# Patient Record
Sex: Female | Born: 1975 | Race: White | Hispanic: No | Marital: Married | Smoking: Never smoker
Health system: Southern US, Community
[De-identification: ages and names within clinical notes are randomized; demographics above are authoritative.]

---

## 2008-05-24 ENCOUNTER — Encounter: Admission: RE | Admit: 2008-05-24 | Discharge: 2008-05-24 | Payer: Self-pay | Admitting: Obstetrics and Gynecology

## 2008-09-17 ENCOUNTER — Encounter: Admission: RE | Admit: 2008-09-17 | Discharge: 2008-09-17 | Payer: Self-pay | Admitting: Obstetrics and Gynecology

## 2009-11-19 HISTORY — PX: KNEE ARTHROSCOPY: SUR90

## 2009-11-19 LAB — HM MAMMOGRAPHY

## 2010-03-23 ENCOUNTER — Encounter: Admission: RE | Admit: 2010-03-23 | Discharge: 2010-03-23 | Payer: Self-pay | Admitting: Specialist

## 2010-05-18 ENCOUNTER — Encounter: Admission: RE | Admit: 2010-05-18 | Discharge: 2010-05-18 | Payer: Self-pay | Admitting: Obstetrics & Gynecology

## 2010-12-11 ENCOUNTER — Encounter: Payer: Self-pay | Admitting: Obstetrics and Gynecology

## 2011-07-26 ENCOUNTER — Other Ambulatory Visit: Payer: Self-pay | Admitting: Specialist

## 2011-07-26 DIAGNOSIS — M25562 Pain in left knee: Secondary | ICD-10-CM

## 2011-07-28 ENCOUNTER — Ambulatory Visit
Admission: RE | Admit: 2011-07-28 | Discharge: 2011-07-28 | Disposition: A | Discharge status: Home / Self Care | Payer: BC Managed Care – PPO | Source: Ambulatory Visit | Attending: Specialist | Admitting: Specialist

## 2011-07-28 DIAGNOSIS — M25562 Pain in left knee: Secondary | ICD-10-CM

## 2011-11-20 LAB — HM PAP SMEAR

## 2012-02-14 LAB — BASIC METABOLIC PANEL: Creatinine: 0.8 mg/dL (ref 0.5–1.1)

## 2012-02-14 LAB — HEPATIC FUNCTION PANEL
ALT: 14 U/L (ref 7–35)
AST: 19 U/L (ref 13–35)
Alkaline Phosphatase: 48 U/L (ref 25–125)
Bilirubin, Total: 0.7 mg/dL

## 2012-02-14 LAB — CBC AND DIFFERENTIAL: HCT: 40 % (ref 36–46)

## 2012-02-14 LAB — LIPID PANEL
Cholesterol: 223 mg/dL — AB (ref 0–200)
HDL: 72 mg/dL — AB (ref 35–70)
Triglycerides: 56 mg/dL (ref 40–160)

## 2012-02-14 LAB — TSH: TSH: 1.78 u[IU]/mL (ref 0.41–5.90)

## 2012-09-12 ENCOUNTER — Telehealth: Payer: Self-pay | Admitting: Internal Medicine

## 2012-09-12 NOTE — Telephone Encounter (Signed)
Okay for schedulers to continue to work with her until this is arranged

## 2012-09-12 NOTE — Telephone Encounter (Signed)
Called pt, no answer, lvmom   

## 2012-09-12 NOTE — Telephone Encounter (Signed)
yes, it is okay to work her in. Thanks

## 2012-09-12 NOTE — Telephone Encounter (Signed)
The patient called and is hoping to be a new patient with Dr.Leschber.  She was given the next available, but stated she needs to be seen sooner and is requesting a new pt apt as soon as possible.  Do you want her added?

## 2012-09-16 NOTE — Telephone Encounter (Signed)
Called pt, no answer, lvmom.  Will try back soon if call isn't returned.

## 2012-09-16 NOTE — Telephone Encounter (Signed)
Ok noted. thanks

## 2012-11-07 ENCOUNTER — Encounter: Payer: Self-pay | Admitting: Internal Medicine

## 2012-11-07 ENCOUNTER — Ambulatory Visit (INDEPENDENT_AMBULATORY_CARE_PROVIDER_SITE_OTHER): Payer: BC Managed Care – PPO | Admitting: Internal Medicine

## 2012-11-07 VITALS — BP 126/78 | HR 79 | Temp 98.1°F | Ht 65.5 in | Wt 152.0 lb

## 2012-11-07 DIAGNOSIS — J309 Allergic rhinitis, unspecified: Secondary | ICD-10-CM | POA: Insufficient documentation

## 2012-11-07 DIAGNOSIS — J45991 Cough variant asthma: Secondary | ICD-10-CM | POA: Insufficient documentation

## 2012-11-07 MED ORDER — FLUTICASONE PROPIONATE HFA 110 MCG/ACT IN AERO: 1.0000 | INHALATION_SPRAY | Freq: Two times a day (BID) | RESPIRATORY_TRACT | Status: AC

## 2012-11-07 MED ORDER — ALBUTEROL SULFATE HFA 108 (90 BASE) MCG/ACT IN AERS: 2.0000 | INHALATION_SPRAY | Freq: Four times a day (QID) | RESPIRATORY_TRACT | Status: DC | PRN

## 2012-11-07 MED ORDER — FLUTICASONE PROPIONATE 50 MCG/ACT NA SUSP
2.0000 | Freq: Every day | NASAL | Status: AC
Start: 2012-11-07 — End: ?

## 2012-11-07 NOTE — Patient Instructions (Signed)
It was good to see you today. We have reviewed your prior records including labs and tests today. Health Maintenance reviewed - all recommended immunizations and age-appropriate screenings are up-to-date. If you develop worsening symptoms or fever, call and we can reconsider antibiotics, but it does not appear necessary to use antibiotics at this time. Start Flonase, Flovent and recuse Albuterol prn - Your prescription(s) have been submitted to your pharmacy. Please take as directed and contact our office if you believe you are having problem(s) with the medication(s). we'll make referral for PFTs . Our office will contact you regarding appointment(s) once made. Please schedule followup in 1-2 years for medical physical, call sooner if problems.

## 2012-11-07 NOTE — Progress Notes (Signed)
  Subjective:    Patient ID: Donna Reilly, female    DOB: 1976/09/04, 36 y.o.   MRN: [id number]  HPI  New pt to me and our practice, here to establish care  complains of cough Ongoing approx 6 weeks -  Describes as dry cough, no sputum associated with headache, mild dyspnea on exertion and occasional exercise induced/cold weather wheeze symptoms worse at night Improved with use of son's inhaled steroid Hx same each fall  History reviewed. No pertinent past medical history.  Family History  Problem Relation Age of Onset  . Lung cancer Paternal Grandmother   . Hyperlipidemia Mother   . Hyperlipidemia Father   . Hypertension Mother   . Hypertension Father   . Coronary artery disease Paternal Uncle   . Cardiomyopathy Brother    History  Substance Use Topics  . Smoking status: Never Smoker   . Smokeless tobacco: Never Used     Comment: local ob-gyn, lives with spouse and 2 kids  . Alcohol Use: Yes    Review of Systems Constitutional: Negative for fever or weight change.  Respiratory: Negative for cough and shortness of breath.   Cardiovascular: Negative for chest pain or palpitations.  Gastrointestinal: Negative for abdominal pain, no bowel changes.  Musculoskeletal: Negative for gait problem or joint swelling.  Skin: Negative for rash.  Neurological: Negative for dizziness or headache.  No other specific complaints in a complete review of systems (except as listed in HPI above).     Objective:   Physical Exam BP 126/78  Pulse 79  Temp 98.1 F (36.7 C) (Oral)  Ht 5' 5.5" (1.664 m)  Wt 152 lb (68.947 kg)  BMI 24.91 kg/m2  SpO2 95% Wt Readings from Last 3 Encounters:  11/07/12 152 lb (68.947 kg)   Constitutional: She appears well-developed and well-nourished. No distress.  HENT: Head: Normocephalic and atraumatic. Ears: B TMs ok, no erythema or effusion; Nose: R nostril with scarring from prior trauma - mild turbinate swelling and pallor. Mouth/Throat: Oropharynx  is clear and moist. No oropharyngeal exudate.  Eyes: Conjunctivae and EOM are normal. Pupils are equal, round, and reactive to light. No scleral icterus.  Neck: Normal range of motion. Neck supple. No JVD or LAD present. No thyromegaly present.  Cardiovascular: Normal rate, regular rhythm and normal heart sounds.  No murmur heard. No BLE edema. Pulmonary/Chest: Effort normal and breath sounds normal. No respiratory distress. She has no wheezes.  Neurological: She is alert and oriented to person, place, and time. No cranial nerve deficit. Coordination normal.  Skin: Skin is warm and dry. No rash noted. No erythema.  Psychiatric: She has a normal mood and affect. Her behavior is normal. Judgment and thought content normal.   Lab Results  Component Value Date   WBC 5.3 02/14/2012   HGB 13.1 02/14/2012   HCT 40 02/14/2012   PLT 244 02/14/2012   CHOL 223* 02/14/2012   TRIG 56 02/14/2012   HDL 72* 02/14/2012   LDLCALC 140 02/14/2012   ALT 14 02/14/2012   AST 19 02/14/2012   NA 140 02/14/2012   K 4.2 02/14/2012   CREATININE 0.8 02/14/2012   BUN 15 02/14/2012   TSH 1.78 02/14/2012       Assessment & Plan:   See problem list. Medications and labs reviewed today.

## 2012-11-07 NOTE — Assessment & Plan Note (Signed)
Seasonal - may contribute to seasonal cough symptoms Start nasal steroid - OTC antihistamine prn

## 2012-11-07 NOTE — Assessment & Plan Note (Signed)
Seasonal dry cough - ongoing for years Also hx cold and exercise induced asthma - Refer for PFTs now Has improved with use of son's inhaled steroid - will rx same and Alb MDI prn

## 2016-02-23 DIAGNOSIS — M20012 Mallet finger of left finger(s): Secondary | ICD-10-CM | POA: Diagnosis not present

## 2016-03-06 DIAGNOSIS — N390 Urinary tract infection, site not specified: Secondary | ICD-10-CM | POA: Diagnosis not present

## 2016-03-21 DIAGNOSIS — M20012 Mallet finger of left finger(s): Secondary | ICD-10-CM | POA: Diagnosis not present

## 2016-04-09 DIAGNOSIS — M20012 Mallet finger of left finger(s): Secondary | ICD-10-CM | POA: Diagnosis not present

## 2016-04-18 DIAGNOSIS — M20012 Mallet finger of left finger(s): Secondary | ICD-10-CM | POA: Diagnosis not present

## 2016-04-18 DIAGNOSIS — M79645 Pain in left finger(s): Secondary | ICD-10-CM | POA: Diagnosis not present

## 2016-08-17 DIAGNOSIS — R509 Fever, unspecified: Secondary | ICD-10-CM | POA: Diagnosis not present

## 2017-03-21 DIAGNOSIS — Z1231 Encounter for screening mammogram for malignant neoplasm of breast: Secondary | ICD-10-CM | POA: Diagnosis not present

## 2017-03-22 ENCOUNTER — Other Ambulatory Visit: Payer: Self-pay | Admitting: Obstetrics & Gynecology

## 2017-03-22 DIAGNOSIS — N6320 Unspecified lump in the left breast, unspecified quadrant: Secondary | ICD-10-CM

## 2017-03-26 ENCOUNTER — Other Ambulatory Visit: Payer: Self-pay | Admitting: Obstetrics & Gynecology

## 2017-03-26 ENCOUNTER — Ambulatory Visit
Admission: RE | Admit: 2017-03-26 | Discharge: 2017-03-26 | Disposition: A | Discharge status: Home / Self Care | Payer: Self-pay | Source: Ambulatory Visit | Attending: Obstetrics & Gynecology | Admitting: Obstetrics & Gynecology

## 2017-03-26 DIAGNOSIS — N6320 Unspecified lump in the left breast, unspecified quadrant: Secondary | ICD-10-CM

## 2017-03-26 DIAGNOSIS — R928 Other abnormal and inconclusive findings on diagnostic imaging of breast: Secondary | ICD-10-CM | POA: Diagnosis not present

## 2017-03-26 DIAGNOSIS — N6489 Other specified disorders of breast: Secondary | ICD-10-CM | POA: Diagnosis not present

## 2017-08-27 DIAGNOSIS — J45909 Unspecified asthma, uncomplicated: Secondary | ICD-10-CM | POA: Diagnosis not present

## 2017-08-27 DIAGNOSIS — J069 Acute upper respiratory infection, unspecified: Secondary | ICD-10-CM | POA: Diagnosis not present

## 2017-08-27 DIAGNOSIS — Z6825 Body mass index (BMI) 25.0-25.9, adult: Secondary | ICD-10-CM | POA: Diagnosis not present

## 2017-09-04 ENCOUNTER — Other Ambulatory Visit: Payer: Self-pay | Admitting: Obstetrics and Gynecology

## 2017-09-04 ENCOUNTER — Ambulatory Visit
Admission: RE | Admit: 2017-09-04 | Discharge: 2017-09-04 | Disposition: A | Discharge status: Home / Self Care | Payer: No Typology Code available for payment source | Source: Ambulatory Visit | Attending: Obstetrics and Gynecology | Admitting: Obstetrics and Gynecology

## 2017-09-04 DIAGNOSIS — R059 Cough, unspecified: Secondary | ICD-10-CM

## 2017-09-04 DIAGNOSIS — Z6825 Body mass index (BMI) 25.0-25.9, adult: Secondary | ICD-10-CM | POA: Diagnosis not present

## 2017-09-04 DIAGNOSIS — J069 Acute upper respiratory infection, unspecified: Secondary | ICD-10-CM | POA: Diagnosis not present

## 2017-09-04 DIAGNOSIS — J309 Allergic rhinitis, unspecified: Secondary | ICD-10-CM | POA: Diagnosis not present

## 2017-09-04 DIAGNOSIS — R05 Cough: Secondary | ICD-10-CM | POA: Diagnosis not present

## 2017-09-04 DIAGNOSIS — J45909 Unspecified asthma, uncomplicated: Secondary | ICD-10-CM | POA: Diagnosis not present

## 2017-09-24 DIAGNOSIS — Z01419 Encounter for gynecological examination (general) (routine) without abnormal findings: Secondary | ICD-10-CM | POA: Diagnosis not present

## 2017-09-24 DIAGNOSIS — Z3202 Encounter for pregnancy test, result negative: Secondary | ICD-10-CM | POA: Diagnosis not present

## 2017-09-24 DIAGNOSIS — Z1151 Encounter for screening for human papillomavirus (HPV): Secondary | ICD-10-CM | POA: Diagnosis not present

## 2017-09-24 DIAGNOSIS — Z30433 Encounter for removal and reinsertion of intrauterine contraceptive device: Secondary | ICD-10-CM | POA: Diagnosis not present

## 2017-09-24 DIAGNOSIS — Z124 Encounter for screening for malignant neoplasm of cervix: Secondary | ICD-10-CM | POA: Diagnosis not present

## 2018-07-29 DIAGNOSIS — J069 Acute upper respiratory infection, unspecified: Secondary | ICD-10-CM | POA: Diagnosis not present

## 2018-07-29 DIAGNOSIS — J45909 Unspecified asthma, uncomplicated: Secondary | ICD-10-CM | POA: Diagnosis not present

## 2018-07-29 DIAGNOSIS — Z6824 Body mass index (BMI) 24.0-24.9, adult: Secondary | ICD-10-CM | POA: Diagnosis not present

## 2018-07-29 DIAGNOSIS — J309 Allergic rhinitis, unspecified: Secondary | ICD-10-CM | POA: Diagnosis not present

## 2018-12-22 ENCOUNTER — Other Ambulatory Visit: Payer: Self-pay | Admitting: Obstetrics

## 2019-12-09 DIAGNOSIS — M25531 Pain in right wrist: Secondary | ICD-10-CM | POA: Diagnosis not present

## 2019-12-09 DIAGNOSIS — M7711 Lateral epicondylitis, right elbow: Secondary | ICD-10-CM | POA: Diagnosis not present

## 2020-01-23 ENCOUNTER — Ambulatory Visit: Payer: No Typology Code available for payment source | Attending: Internal Medicine

## 2020-01-23 DIAGNOSIS — Z23 Encounter for immunization: Secondary | ICD-10-CM | POA: Insufficient documentation

## 2020-01-23 NOTE — Progress Notes (Signed)
   Covid-19 Vaccination Clinic  Name:  Margeret Stachnik    MRN: [id number] DOB: 1976-10-11  01/23/2020  Ms. Bunker was observed post Covid-19 immunization for 15 minutes without incident. She was provided with Vaccine Information Sheet and instruction to access the V-Safe system.   Ms. Koslow was instructed to call 911 with any severe reactions post vaccine: Marland Kitchen Difficulty breathing  . Swelling of face and throat  . A fast heartbeat  . A bad rash all over body  . Dizziness and weakness   Immunizations Administered    Name Date Dose VIS Date Route   Pfizer COVID-19 Vaccine 01/23/2020  9:18 AM 0.3 mL 10/30/2019 Intramuscular   Manufacturer: [place]   Lot: [id number]   [place]: [id number]

## 2020-02-13 ENCOUNTER — Ambulatory Visit: Payer: No Typology Code available for payment source | Attending: Internal Medicine

## 2020-03-15 DIAGNOSIS — H5203 Hypermetropia, bilateral: Secondary | ICD-10-CM | POA: Diagnosis not present

## 2020-03-15 DIAGNOSIS — H524 Presbyopia: Secondary | ICD-10-CM | POA: Diagnosis not present

## 2020-12-12 ENCOUNTER — Other Ambulatory Visit: Payer: Self-pay | Admitting: Obstetrics & Gynecology

## 2020-12-12 DIAGNOSIS — N644 Mastodynia: Secondary | ICD-10-CM

## 2020-12-14 ENCOUNTER — Other Ambulatory Visit: Payer: No Typology Code available for payment source

## 2021-03-31 ENCOUNTER — Other Ambulatory Visit (HOSPITAL_COMMUNITY): Payer: Self-pay | Admitting: Cardiology

## 2021-03-31 ENCOUNTER — Ambulatory Visit (HOSPITAL_COMMUNITY)
Admission: RE | Admit: 2021-03-31 | Discharge: 2021-03-31 | Disposition: A | Discharge status: Home / Self Care | Payer: BC Managed Care – PPO | Source: Ambulatory Visit | Attending: Cardiology | Admitting: Cardiology

## 2021-03-31 ENCOUNTER — Other Ambulatory Visit: Payer: Self-pay

## 2021-03-31 DIAGNOSIS — I2510 Atherosclerotic heart disease of native coronary artery without angina pectoris: Secondary | ICD-10-CM

## 2021-05-16 LAB — COLOGUARD: COLOGUARD: NEGATIVE

## 2021-08-05 IMAGING — CT CT CARDIAC CORONARY ARTERY CALCIUM SCORE
3 series · 14 of 20 positions shown, 15 images · non-contrast
Comparison: None.

Addendum:
CLINICAL DATA: Cardiovascular Disease Risk stratification

EXAM:
Coronary Calcium Score
TECHNIQUE: A gated, non-contrast computed tomography scan of the heart was
performed using 3mm slice thickness. Axial images were analyzed on a
dedicated workstation. Calcium scoring of the coronary arteries was
performed using the Agatston method.

[Series 3: 2 hrt calcium · axial · 0.31mm/px · z∈[-26,+55]mm · 4 of 47 slices shown, 5 images]
[im 10/47  vessel]
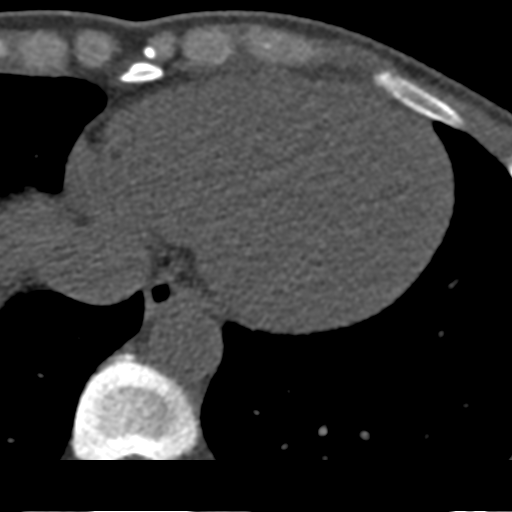
[im 10/47  lung]
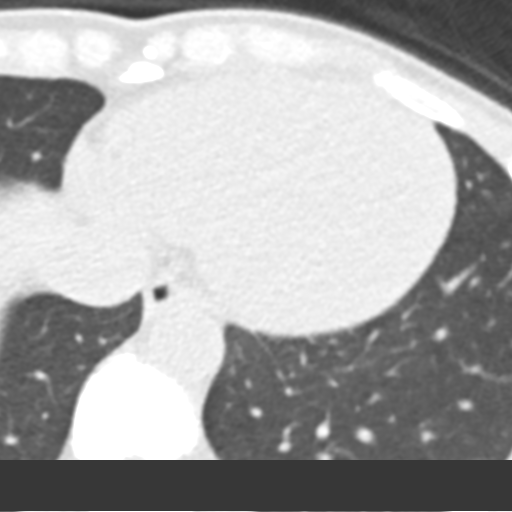
[im 19/47  vessel]
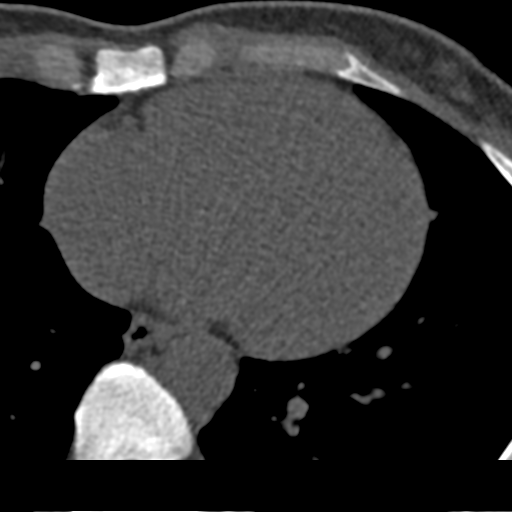
[im 28/47  vessel]
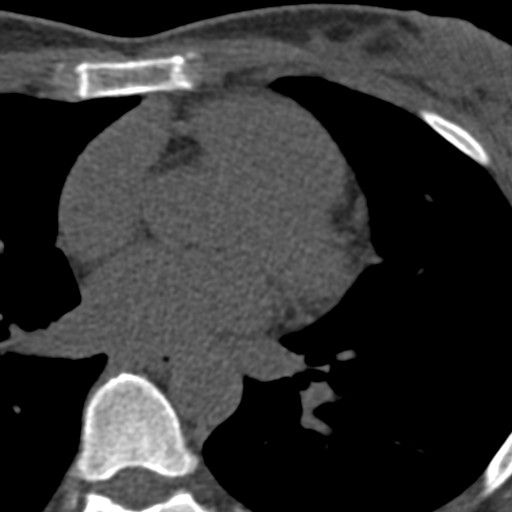
[im 37/47  vessel]
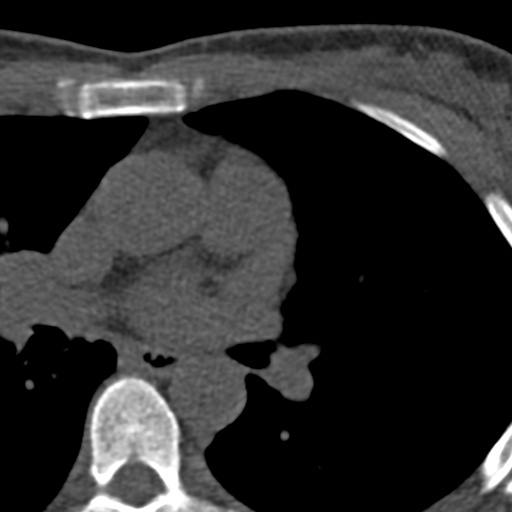

[Series 4: 2 soft full fov 74 % · axial · 0.74mm/px · z∈[-35,+61]mm · 5 of 48 slices shown]
[im 8/48  vessel]
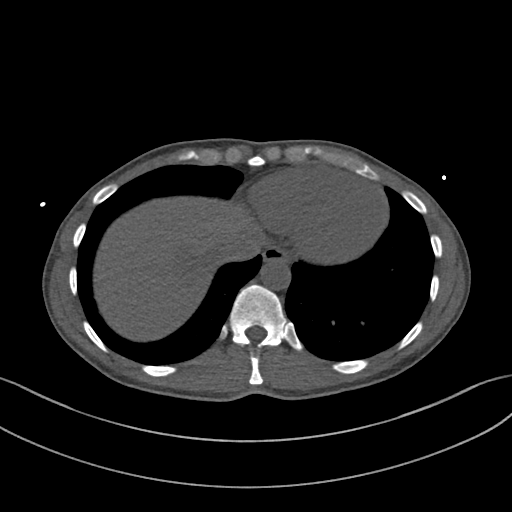
[im 16/48  vessel]
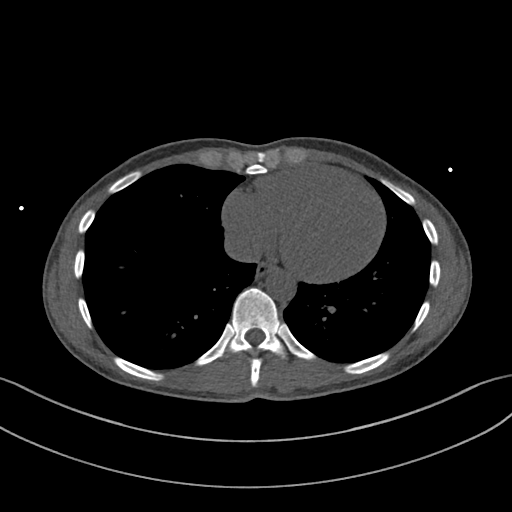
[im 24/48  vessel]
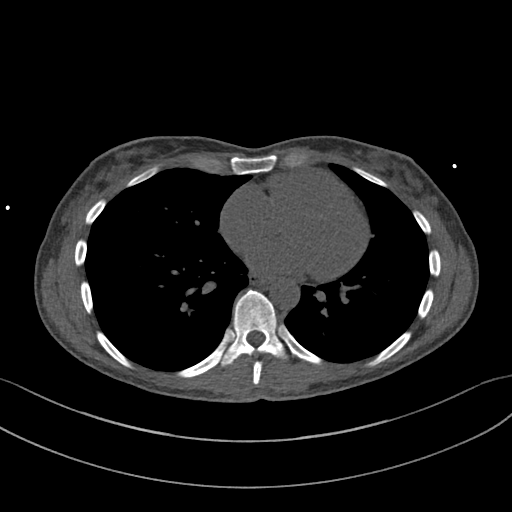
[im 32/48  vessel]
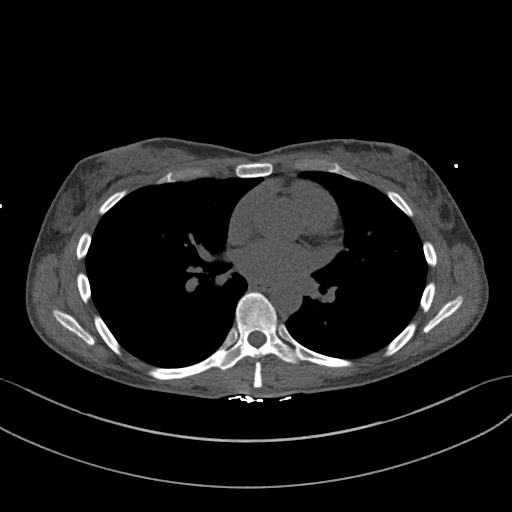
[im 40/48  vessel]
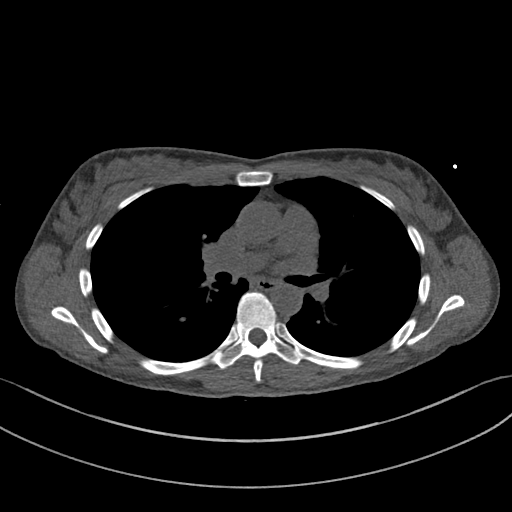

[Series 6: 2 lungs 74 % · axial · 0.74mm/px · z∈[-35,+61]mm · 5 of 48 slices shown]
[im 8/48  vessel]
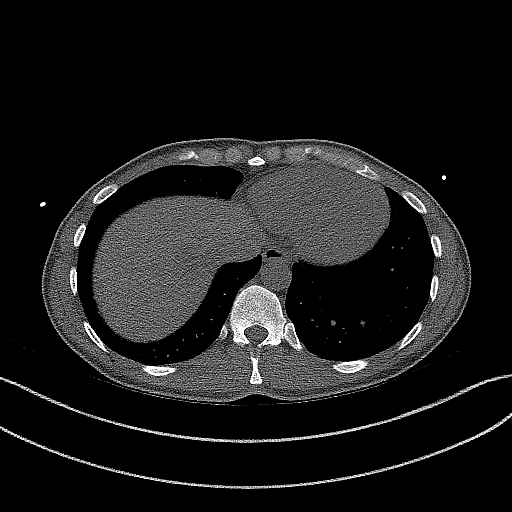
[im 16/48  vessel]
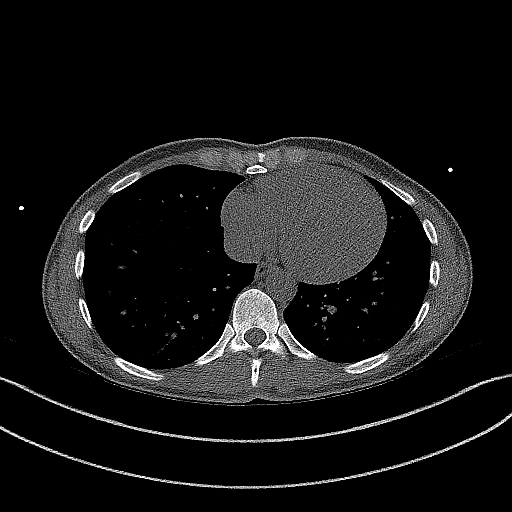
[im 24/48  vessel]
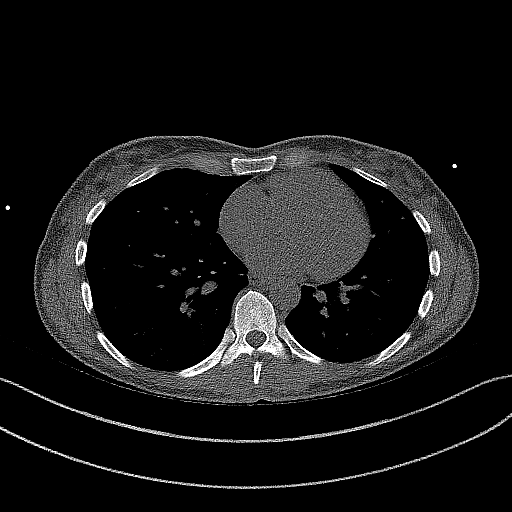
[im 32/48  vessel]
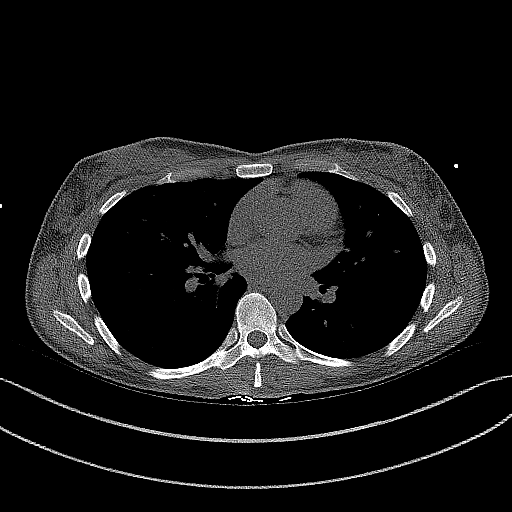
[im 40/48  vessel]
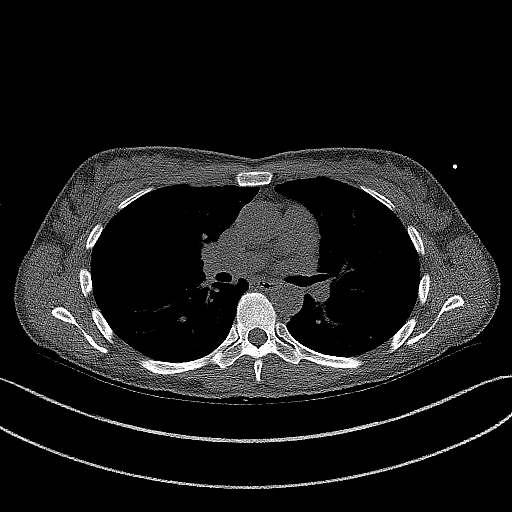

[14 of 20 positions shown; findings below may reference images not displayed]

FINDINGS: Coronary arteries: Normal origins.

Coronary Calcium Score:

Left main: 0

Left anterior descending artery: 0

Left circumflex artery: 0

Right coronary artery: 0

Total: 0

Percentile: 0

Pericardium: Normal.

Ascending Aorta: Normal caliber.

Non-cardiac: See separate report from [REDACTED].
IMPRESSION: Coronary calcium score of 0. This was 0 percentile for age-, race-,
and sex-matched controls.



If CAC=0, it is reasonable to withhold statin therapy and reassess
in 5 to 10 years, as long as higher risk conditions are absent
(diabetes mellitus, family history of premature CHD in first degree
relatives (males <55 years; females <65 years), cigarette smoking,
or LDL >=190 mg/dL).

If CAC is 1 to 99, it is reasonable to initiate statin therapy for
patients >=55 years of age.

If CAC is >=100 or >=75th percentile, it is reasonable to initiate
statin therapy at any age.

Cardiology referral should be considered for patients with CAC
scores >=400 or >=75th percentile.

*1702 AHA/ACC/AACVPR/AAPA/ABC/CONVEXA/REDON/MYHRE/Jito/SHUKRI/MOLANO/TOLBERT
Guideline on the Management of Blood Cholesterol: A Report of the
American College of Cardiology/American Heart Association Task Force
on Clinical Practice Guidelines. J Am Coll Cardiol.
1667;73(24):1836-1016.

EXAM:
OVER-READ INTERPRETATION  CT CHEST

The following report is an over-read performed by radiologist Dr.
Gabla Bilijo [REDACTED] on 04/03/2021. This over-read
does not include interpretation of cardiac or coronary anatomy or
pathology. The coronary calcium score interpretation by the
cardiologist is attached.
FINDINGS: Mediastinum/Nodes: No mediastinal lymphadenopathy. The visualized
trachea and esophagus are within normal limits.

Lungs/Pleura: No focal consolidations, evidence of pleural effusion,
or pneumothorax. No suspicious pulmonary nodules.

Chest Wall: Within normal limits.

Upper Abdomen: Within normal limits.

Musculoskeletal: No acute osseous abnormality or aggressive
appearing osseous lesion.
IMPRESSION: No significant extracardiac thoracic abnormality.

*** End of Addendum ***
FINDINGS: Coronary arteries: Normal origins.

Coronary Calcium Score:

Left main: 0

Left anterior descending artery: 0

Left circumflex artery: 0

Right coronary artery: 0

Total: 0

Percentile: 0

Pericardium: Normal.

Ascending Aorta: Normal caliber.

Non-cardiac: See separate report from [REDACTED].
IMPRESSION: Coronary calcium score of 0. This was 0 percentile for age-, race-,
and sex-matched controls.



If CAC=0, it is reasonable to withhold statin therapy and reassess
in 5 to 10 years, as long as higher risk conditions are absent
(diabetes mellitus, family history of premature CHD in first degree
relatives (males <55 years; females <65 years), cigarette smoking,
or LDL >=190 mg/dL).

If CAC is 1 to 99, it is reasonable to initiate statin therapy for
patients >=55 years of age.

If CAC is >=100 or >=75th percentile, it is reasonable to initiate
statin therapy at any age.

Cardiology referral should be considered for patients with CAC
scores >=400 or >=75th percentile.

*1702 AHA/ACC/AACVPR/AAPA/ABC/CONVEXA/REDON/MYHRE/Jito/SHUKRI/MOLANO/TOLBERT
Guideline on the Management of Blood Cholesterol: A Report of the
American College of Cardiology/American Heart Association Task Force
on Clinical Practice Guidelines. J Am Coll Cardiol.
1667;73(24):1836-1016.

## 2021-12-08 ENCOUNTER — Other Ambulatory Visit (HOSPITAL_COMMUNITY): Payer: Self-pay | Admitting: Obstetrics & Gynecology

## 2021-12-08 DIAGNOSIS — N644 Mastodynia: Secondary | ICD-10-CM

## 2023-06-16 ENCOUNTER — Encounter (HOSPITAL_COMMUNITY): Payer: Self-pay

## 2023-06-16 ENCOUNTER — Emergency Department (HOSPITAL_COMMUNITY): Payer: BC Managed Care – PPO

## 2023-06-16 ENCOUNTER — Inpatient Hospital Stay (HOSPITAL_COMMUNITY)
Admission: EM | Admit: 2023-06-16 | Discharge: 2023-06-17 | DRG: 254 | Disposition: A | Discharge status: Home / Self Care | Payer: BC Managed Care – PPO | Attending: Pulmonary Disease | Admitting: Pulmonary Disease

## 2023-06-16 ENCOUNTER — Inpatient Hospital Stay (HOSPITAL_COMMUNITY): Payer: BC Managed Care – PPO

## 2023-06-16 ENCOUNTER — Ambulatory Visit (HOSPITAL_BASED_OUTPATIENT_CLINIC_OR_DEPARTMENT_OTHER)
Admission: RE | Admit: 2023-06-16 | Discharge: 2023-06-16 | Disposition: A | Discharge status: Home / Self Care | Payer: BC Managed Care – PPO | Source: Ambulatory Visit | Attending: Obstetrics and Gynecology | Admitting: Obstetrics and Gynecology

## 2023-06-16 ENCOUNTER — Other Ambulatory Visit: Payer: Self-pay | Admitting: Obstetrics and Gynecology

## 2023-06-16 ENCOUNTER — Other Ambulatory Visit: Payer: Self-pay

## 2023-06-16 DIAGNOSIS — M7989 Other specified soft tissue disorders: Secondary | ICD-10-CM

## 2023-06-16 DIAGNOSIS — Z793 Long term (current) use of hormonal contraceptives: Secondary | ICD-10-CM

## 2023-06-16 DIAGNOSIS — I82622 Acute embolism and thrombosis of deep veins of left upper extremity: Secondary | ICD-10-CM | POA: Diagnosis present

## 2023-06-16 DIAGNOSIS — Z880 Allergy status to penicillin: Secondary | ICD-10-CM

## 2023-06-16 DIAGNOSIS — Z79899 Other long term (current) drug therapy: Secondary | ICD-10-CM

## 2023-06-16 DIAGNOSIS — I82B12 Acute embolism and thrombosis of left subclavian vein: Principal | ICD-10-CM | POA: Diagnosis present

## 2023-06-16 DIAGNOSIS — Z975 Presence of (intrauterine) contraceptive device: Secondary | ICD-10-CM | POA: Diagnosis not present

## 2023-06-16 DIAGNOSIS — I82A12 Acute embolism and thrombosis of left axillary vein: Secondary | ICD-10-CM

## 2023-06-16 DIAGNOSIS — Z7951 Long term (current) use of inhaled steroids: Secondary | ICD-10-CM | POA: Diagnosis not present

## 2023-06-16 DIAGNOSIS — T402X5A Adverse effect of other opioids, initial encounter: Secondary | ICD-10-CM | POA: Diagnosis not present

## 2023-06-16 DIAGNOSIS — F419 Anxiety disorder, unspecified: Secondary | ICD-10-CM | POA: Diagnosis present

## 2023-06-16 DIAGNOSIS — L299 Pruritus, unspecified: Secondary | ICD-10-CM | POA: Diagnosis not present

## 2023-06-16 DIAGNOSIS — R519 Headache, unspecified: Secondary | ICD-10-CM | POA: Diagnosis not present

## 2023-06-16 DIAGNOSIS — I82409 Acute embolism and thrombosis of unspecified deep veins of unspecified lower extremity: Secondary | ICD-10-CM | POA: Diagnosis present

## 2023-06-16 HISTORY — PX: IR INFUSION THROMBOL VENOUS INITIAL (MS): IMG5377

## 2023-06-16 HISTORY — PX: IR VENO/EXT/UNI LEFT: IMG675

## 2023-06-16 HISTORY — PX: IR ANGIOGRAM SELECTIVE EACH ADDITIONAL VESSEL: IMG667

## 2023-06-16 HISTORY — PX: IR US GUIDE VASC ACCESS LEFT: IMG2389

## 2023-06-16 LAB — CBC WITH DIFFERENTIAL/PLATELET
Abs Immature Granulocytes: 0.01 10*3/uL (ref 0.00–0.07)
Basophils Absolute: 0 10*3/uL (ref 0.0–0.1)
Basophils Relative: 0 %
Eosinophils Absolute: 0 10*3/uL (ref 0.0–0.5)
Eosinophils Relative: 0 %
HCT: 40.7 % (ref 36.0–46.0)
Hemoglobin: 13.7 g/dL (ref 12.0–15.0)
Immature Granulocytes: 0 %
Lymphocytes Relative: 27 %
Lymphs Abs: 2 10*3/uL (ref 0.7–4.0)
MCH: 31.5 pg (ref 26.0–34.0)
MCHC: 33.7 g/dL (ref 30.0–36.0)
MCV: 93.6 fL (ref 80.0–100.0)
Monocytes Absolute: 0.6 10*3/uL (ref 0.1–1.0)
Monocytes Relative: 7 %
Neutro Abs: 5 10*3/uL (ref 1.7–7.7)
Neutrophils Relative %: 66 %
Platelets: 258 10*3/uL (ref 150–400)
RBC: 4.35 MIL/uL (ref 3.87–5.11)
RDW: 12.2 % (ref 11.5–15.5)
WBC: 7.7 10*3/uL (ref 4.0–10.5)
nRBC: 0 % (ref 0.0–0.2)

## 2023-06-16 LAB — COMPREHENSIVE METABOLIC PANEL
ALT: 12 U/L (ref 0–44)
AST: 17 U/L (ref 15–41)
Albumin: 4.5 g/dL (ref 3.5–5.0)
Alkaline Phosphatase: 44 U/L (ref 38–126)
Anion gap: 11 (ref 5–15)
BUN: 14 mg/dL (ref 6–20)
CO2: 23 mmol/L (ref 22–32)
Calcium: 9.7 mg/dL (ref 8.9–10.3)
Chloride: 102 mmol/L (ref 98–111)
Creatinine, Ser: 0.85 mg/dL (ref 0.44–1.00)
GFR, Estimated: >60 mL/min (ref >60)
Glucose, Bld: 94 mg/dL (ref 70–99)
Potassium: 3.7 mmol/L (ref 3.5–5.1)
Sodium: 136 mmol/L (ref 135–145)
Total Bilirubin: 0.9 mg/dL (ref 0.3–1.2)
Total Protein: 8.2 g/dL — ABNORMAL HIGH (ref 6.5–8.1)

## 2023-06-16 LAB — PROTIME-INR
INR: 1.1 (ref 0.8–1.2)
Prothrombin Time: 14.6 seconds (ref 11.4–15.2)

## 2023-06-16 LAB — ANTITHROMBIN III: AntiThromb III Func: 109 % (ref 75–120)

## 2023-06-16 LAB — APTT: aPTT: 31 seconds (ref 24–36)

## 2023-06-16 LAB — HCG, SERUM, QUALITATIVE: Preg, Serum: NEGATIVE

## 2023-06-16 LAB — MRSA NEXT GEN BY PCR, NASAL: MRSA by PCR Next Gen: NOT DETECTED

## 2023-06-16 MED ORDER — SODIUM CHLORIDE 0.9 % IV BOLUS
1000.0000 mL | Freq: Once | INTRAVENOUS | Status: AC
  Administered 2023-06-16: 1000 mL via INTRAVENOUS

## 2023-06-16 MED ORDER — ORAL CARE MOUTH RINSE: 15.0000 mL | OROMUCOSAL | Status: DC | PRN

## 2023-06-16 MED ORDER — SODIUM CHLORIDE 0.9 % IV SOLN: Freq: Once | INTRAVENOUS | Status: AC

## 2023-06-16 MED ORDER — ALBUTEROL SULFATE (2.5 MG/3ML) 0.083% IN NEBU: 2.5000 mg | INHALATION_SOLUTION | RESPIRATORY_TRACT | Status: DC | PRN

## 2023-06-16 MED ORDER — IOHEXOL 300 MG/ML  SOLN
150.0000 mL | Freq: Once | INTRAMUSCULAR | Status: AC | PRN
  Administered 2023-06-16: 35 mL via INTRAVENOUS

## 2023-06-16 MED ORDER — SODIUM CHLORIDE 0.9 % IV SOLN
250.0000 mL | INTRAVENOUS | Status: DC | PRN
  Administered 2023-06-16: 250 mL via INTRAVENOUS

## 2023-06-16 MED ORDER — MIDAZOLAM HCL 2 MG/2ML IJ SOLN
INTRAMUSCULAR | Status: AC
  Filled 2023-06-16: qty 2

## 2023-06-16 MED ORDER — ONDANSETRON HCL 4 MG/2ML IJ SOLN: 4.0000 mg | Freq: Four times a day (QID) | INTRAMUSCULAR | Status: DC | PRN

## 2023-06-16 MED ORDER — HEPARIN (PORCINE) 25000 UT/250ML-% IV SOLN
1100.0000 [IU]/h | INTRAVENOUS | Status: DC
  Administered 2023-06-16: 1100 [IU]/h via INTRAVENOUS
  Filled 2023-06-16: qty 250

## 2023-06-16 MED ORDER — HEPARIN (PORCINE) 25000 UT/250ML-% IV SOLN
1150.0000 [IU]/h | INTRAVENOUS | Status: DC
  Administered 2023-06-16: 800 [IU]/h via INTRAVENOUS

## 2023-06-16 MED ORDER — DIPHENHYDRAMINE HCL 12.5 MG/5ML PO ELIX: 25.0000 mg | ORAL_SOLUTION | Freq: Four times a day (QID) | ORAL | Status: DC | PRN

## 2023-06-16 MED ORDER — HEPARIN BOLUS VIA INFUSION
5000.0000 [IU] | Freq: Once | INTRAVENOUS | Status: AC
  Administered 2023-06-16: 5000 [IU] via INTRAVENOUS
  Filled 2023-06-16: qty 5000

## 2023-06-16 MED ORDER — HYDROMORPHONE HCL 2 MG PO TABS
1.0000 mg | ORAL_TABLET | ORAL | Status: DC | PRN
  Administered 2023-06-16: 1 mg via ORAL
  Filled 2023-06-16: qty 1

## 2023-06-16 MED ORDER — SODIUM CHLORIDE 0.9% FLUSH: 3.0000 mL | INTRAVENOUS | Status: DC | PRN

## 2023-06-16 MED ORDER — SODIUM CHLORIDE 0.9 % IV SOLN
1.0000 mg/h | INTRAVENOUS | Status: DC
  Administered 2023-06-16: 1 mg/h
  Filled 2023-06-16: qty 24

## 2023-06-16 MED ORDER — FENTANYL CITRATE (PF) 100 MCG/2ML IJ SOLN
INTRAMUSCULAR | Status: AC
  Filled 2023-06-16: qty 2

## 2023-06-16 MED ORDER — CHLORHEXIDINE GLUCONATE CLOTH 2 % EX PADS
6.0000 | MEDICATED_PAD | Freq: Every day | CUTANEOUS | Status: DC
  Administered 2023-06-16: 6 via TOPICAL

## 2023-06-16 MED ORDER — MIDAZOLAM HCL 2 MG/2ML IJ SOLN
INTRAMUSCULAR | Status: AC | PRN
  Administered 2023-06-16: 1 mg via INTRAVENOUS
  Administered 2023-06-16 (×2): .5 mg via INTRAVENOUS

## 2023-06-16 MED ORDER — POLYETHYLENE GLYCOL 3350 17 G PO PACK: 17.0000 g | PACK | Freq: Every day | ORAL | Status: DC | PRN

## 2023-06-16 MED ORDER — SODIUM CHLORIDE 0.9% FLUSH: 3.0000 mL | Freq: Two times a day (BID) | INTRAVENOUS | Status: DC

## 2023-06-16 MED ORDER — FENTANYL CITRATE (PF) 100 MCG/2ML IJ SOLN
INTRAMUSCULAR | Status: AC | PRN
Start: 2023-06-16 — End: 2023-06-16
  Administered 2023-06-16: 25 ug via INTRAVENOUS
  Administered 2023-06-16: 50 ug via INTRAVENOUS
  Administered 2023-06-16: 25 ug via INTRAVENOUS

## 2023-06-16 MED ORDER — LIDOCAINE HCL 1 % IJ SOLN
20.0000 mL | Freq: Once | INTRAMUSCULAR | Status: AC
  Administered 2023-06-16: 2 mL via INTRADERMAL
  Filled 2023-06-16: qty 20

## 2023-06-16 MED ORDER — DOCUSATE SODIUM 100 MG PO CAPS: 100.0000 mg | ORAL_CAPSULE | Freq: Two times a day (BID) | ORAL | Status: DC | PRN

## 2023-06-16 MED ORDER — LIDOCAINE HCL 1 % IJ SOLN
INTRAMUSCULAR | Status: AC
  Filled 2023-06-16: qty 20

## 2023-06-16 NOTE — Progress Notes (Signed)
VASCULAR LAB    Left upper extremity venous duplex has been performed.  See CV proc for preliminary results.   Kymoni Lesperance, RVT 06/16/2023, 3:56 PM

## 2023-06-16 NOTE — ED Provider Notes (Signed)
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 mL ( Intravenous Canceled Entry 06/16/23 2257)  sodium chloride flush (NS) 0.9 % injection 3 mL (has no administration in time range)  0.9 %  sodium chloride infusion (250 mLs Intravenous New Bag/Given 06/16/23 2228)  heparin ADULT infusion 100 units/mL (25000 units/24mL) (800 Units/hr Intravenous New Bag/Given 06/16/23 2227)  alteplase (LIMB ISCHEMIA) 24 mg in normal saline (0.048 mg/mL) infusion (1 mg/hr Intracatheter Infusion Verify 06/16/23 2227)  Oral care mouth rinse (has no administration in time range)  HYDROmorphone (DILAUDID) tablet 1 mg (1 mg Oral Given 06/16/23 2246)  diphenhydrAMINE (BENADRYL) 12.5 MG/5ML elixir 25 mg (has no administration in time range)  sodium chloride 0.9 % bolus 1,000 mL (0 mLs Intravenous Stopped 06/16/23 1836)  heparin bolus via infusion 5,000 Units (5,000 Units Intravenous Bolus from Bag 06/16/23 1945)  0.9 %  sodium chloride infusion (0 mLs Intravenous Stopped 06/16/23 2047)  iohexol (OMNIPAQUE) 300 MG/ML solution 150 mL (35 mLs Intravenous  Contrast Given 06/16/23 2135)  lidocaine (XYLOCAINE) 1 % (with pres) injection 20 mL (2 mLs Intradermal Given 06/16/23 2125)  midazolam (VERSED) injection (0.5 mg Intravenous Given 06/16/23 2131)  fentaNYL (SUBLIMAZE) injection (25 mcg Intravenous  Given 06/16/23 2132)    ED Course/ Medical Decision Making/ A&P                             Medical Decision Making Amount and/or Complexity of Data Reviewed Labs: ordered.    Details: Normal hemoglobin.  Unremarkable renal function. Radiology: ordered and independent interpretation performed.    Details: +DVT  Risk Prescription drug management. Decision regarding hospitalization.   Patient is neurovascular intact.  Discussed with vascular surgery, Dr. Durwin Nora.  Recommends consulting IR for possible thrombolysis.  Will need anticoagulation and eval for first rib.  He does recommend getting a chest x-ray.  Discussed with Dr. Loreta Ave of IR.  He recommends starting her on heparin and he will do thrombolysis tonight.  She will need an ICU bed and Dr. Gaynell Face was consulted for admission.  Otherwise, the patient is not showing signs/symptoms of PE.         Final Clinical Impression(s) / ED Diagnoses Final diagnoses:  Acute deep vein thrombosis (DVT) of axillary vein of left upper extremity [facility])    Rx / DC Orders ED Discharge Orders     None         Pricilla Loveless, [place] 06/16/23 2313

## 2023-06-16 NOTE — H&P (Addendum)
NAME:  Donna Reilly, MRN:  [id number], DOB:  September 23, 1976, LOS: 0 ADMISSION DATE:  06/16/2023, CONSULTATION DATE:  7/28 REFERRING MD:  Dr. Criss Alvine, CHIEF COMPLAINT:  UE dvt   History of Present Illness:  Patient is a 47 year old female with no significant PMH presents to [facility] ED on 7/28 w/ DVT.  Patient had left axillary and left arm pain on 7/23. Over a week ago patient went on white water rafting trip in [place]. Patient denies any chest pain or SOB. Denies any hx of DVT, cancer, recent surgeries, trauma/injuries, tobacco abuse. Has mirena IUD for birth control. Patient began to have increased swelling of arm and began having tingling in fingers. 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  Patient came to [facility] ED on 7/28 for concern for DVT. BP stable and sats 100% on room air. HR 70s. Beta hCG negative. Patient with some swelling in LUE. DVT US showing dvt in left subclavian, axillary, brachial veins. IR consulted w/ plan for catheter directed lysis. PCCM consulted for icu admission  Pertinent  Medical History  History reviewed. No pertinent past medical history.   Significant Hospital Events: Including procedures, antibiotic start and stop dates in addition to other pertinent events   7/28 left UE dvt; IR to place catheter directed lysis  Interim History / Subjective:  See above  Objective   Blood pressure 111/74, pulse 73, temperature 98.2 F (36.8 C), temperature source Oral, resp. rate 18, height 5' 5.5" (1.664 m), weight 64.4 kg, SpO2 100%.        Intake/Output Summary (Last 24 hours) at 06/16/2023 1952 Last data filed at 06/16/2023 1836 Gross per 24 hour  Intake 1000 ml  Output --  Net 1000 ml   Filed Weights   06/16/23 1618  Weight: 64.4 kg    Examination: General: NAD HEENT: MM pink/moist Neuro: Aox3; MAE CV: s1s2, RRR, no m/r/g PULM:  dim clear BS bilaterally; room air GI: soft, bsx4 active  Extremities: warm/dry, LUE swelling appreciated; no LE edema   Skin: no rashes or lesions    Resolved Hospital Problem list     Assessment & Plan:  LUE DVT -only risk factor is recent travel to [facility]; f/u hypercoagulable panel Plan: -IR consulted; plan to place catheter directed lytic therapy -admit to icu w/ continuous telemetry -heparin gtt -follow up hypercoagulable panel: factor V leiden, lupus anticoagulant, homocysteine, antithrombin III, protein C, protein S     Best Practice (right click and "Reselect all SmartList Selections" daily)   Diet/type: NPO DVT prophylaxis: systemic heparin; catheter directed TPA GI prophylaxis: N/A Lines: N/A Foley:  N/A Code Status:  full code Last date of multidisciplinary goals of care discussion [7/28 updated patient and husband at bedside]  Labs   CBC: Recent Labs  Lab 06/16/23 1649  WBC 7.7  NEUTROABS 5.0  HGB 13.7  HCT 40.7  MCV 93.6  PLT 258    Basic Metabolic Panel: Recent Labs  Lab 06/16/23 1649  NA 136  K 3.7  CL 102  CO2 23  GLUCOSE 94  BUN 14  CREATININE 0.85  CALCIUM 9.7   GFR: Estimated Creatinine Clearance: 74.4 mL/min (by C-G formula based on SCr of 0.85 mg/dL). Recent Labs  Lab 06/16/23 1649  WBC 7.7    Liver Function Tests: Recent Labs  Lab 06/16/23 1649  AST 17  ALT 12  ALKPHOS 44  BILITOT 0.9  PROT 8.2*  ALBUMIN 4.5   No results for input(s): "LIPASE", "AMYLASE" in  the last 168 hours. No results for input(s): "AMMONIA" in the last 168 hours.  ABG No results found for: "PHART", "PCO2ART", "PO2ART", "HCO3", "TCO2", "ACIDBASEDEF", "O2SAT"   Coagulation Profile: Recent Labs  Lab 06/16/23 1649  INR 1.1    Cardiac Enzymes: No results for input(s): "CKTOTAL", "CKMB", "CKMBINDEX", "TROPONINI" in the last 168 hours.  HbA1C: No results found for: "HGBA1C"  CBG: No results for input(s): "GLUCAP" in the last 168 hours.  Review of Systems:   Review of Systems  Constitutional:  Negative for fever.  Respiratory:  Negative for shortness of  breath.   Cardiovascular:  Negative for chest pain and leg swelling.  Gastrointestinal:  Negative for abdominal pain, nausea and vomiting.  Musculoskeletal:        LUE swelling w/ pain     Past Medical History:  She,  has no past medical history on file.   Surgical History:   Past Surgical History:  Procedure Laterality Date   KNEE ARTHROSCOPY  2011     Social History:   reports that she has never smoked. She has never used smokeless tobacco. She reports current alcohol use. She reports that she does not use drugs.   Family History:  Her family history includes Cardiomyopathy in her brother; Coronary artery disease in her paternal uncle; Hyperlipidemia in her father and mother; Hypertension in her father and mother; Lung cancer in her paternal grandmother.   Allergies Allergies  Allergen Reactions   Penicillins      Home Medications  Prior to Admission medications   Medication Sig Start Date End Date Taking? Authorizing Provider  albuterol (PROVENTIL HFA;VENTOLIN HFA) 108 (90 BASE) MCG/ACT inhaler Inhale 2 puffs into the lungs every 6 (six) hours as needed for wheezing. 11/07/12   Newt Lukes, MD  fluticasone (FLONASE) 50 MCG/ACT nasal spray Place 2 sprays into the nose daily. 11/07/12   Newt Lukes, MD  fluticasone (FLOVENT HFA) 110 MCG/ACT inhaler Inhale 1 puff into the lungs 2 (two) times daily. 11/07/12   Newt Lukes, MD  levonorgestrel (MIRENA) 20 MCG/24HR IUD 1 each by Intrauterine route once.    [provider]     Critical care time: 45 minutes    JD Anselm Lis [place] 06/16/2023, 7:52 PM  Please see [email or web address] for pager details.  From 7A-7P if no response, please call [phone]. After hours, please call ELink [phone].

## 2023-06-16 NOTE — Progress Notes (Addendum)
eLink Physician-Brief Progress Note Patient Name: Donna Reilly DOB: [birth year] MRN: [id number]   Date of Service  06/16/2023  HPI/Events of Note  47 year old with no significant past medical history initially presents to the emergency department with new onset DVT in the subclavian, axillary, and brachial veins with the intent to undergo catheter directed lysis.  Vital signs within normal limits.  Metabolic panel and CBC within normal limits.  Chest radiograph unremarkable.  eICU Interventions  In IR for procedure.  No immediate intervention is indicated.  No indication for GI prophylaxis.  DVT treatment with therapeutic heparin.   2226 - returned from IR, experiencing some pain at the insertion site, having some anxiety. Itching w/ oxycodone and hydrocodone. Add Hydromorphone PRN  Intervention Category Evaluation Type: New Patient Evaluation  Corneilus Heggie 06/16/2023, 9:06 PM

## 2023-06-16 NOTE — Procedures (Addendum)
Interventional Radiology Procedure Note  Procedure:   US guided access left brachial vein Venogram Placement of lysis catheter for initiation of thrombolysis. 90cm unifuse catheter, 30cm infusion length.   Complications: None  Recommendations:  - ICU overnight - tPA infusion 1mg /hr until tomorrow's follow up angio.  Tentatively scheduled for 10-11am.  Will need to check with VIR charge/control room in am. - Q6hr H&H - Q6hr fibrinogen - Q6hr heparin level.  Low level heparin therapy during tPA infusion.  Management by pharmacy thank you - Call VIR on call for any bleeding, new severe headache - Call VIR on call for fibrinogen <150 - OK to draw labs via the left arm sheath if needed - Clear liquids until 2am, then NPO for repeat angio Monday morning  Signed,  Yvone Neu. Loreta Ave, DO, ABVM, RPVI

## 2023-06-16 NOTE — ED Provider Triage Note (Signed)
Emergency Medicine Provider Triage Evaluation Note  Kenaya Voller , a 47 y.o. female  was evaluated in triage.  Pt complains of upper extremity swelling and heaviness with discomfort in the axilla x 1 week after white water rafting.  Patient is concerned she has a DVT.  Outpatient ordered DVT study is confirmatory for left upper extremity DVT from the axillary to the innominate..  Review of Systems  Positive: DVT Negative: Chest pain shortness of breath  Physical Exam  BP (!) 123/91 (BP Location: Right Arm)   Pulse 75   Temp 98.2 F (36.8 C) (Oral)   Resp 18   Ht 5' 5.5" (1.664 m)   Wt 64.4 kg   LMP  (LMP Unknown)   SpO2 100%   BMI 23.27 kg/m  Gen:   Awake, no distress   Resp:  Normal effort  MSK:   Moves extremities without difficulty  Other:    Medical Decision Making  Medically screening exam initiated at 4:24 PM.  Appropriate orders placed.  Monteen Melis was informed that the remainder of the evaluation will be completed by another provider, this initial triage assessment does not replace that evaluation, and the importance of remaining in the ED until their evaluation is complete.  High concern for potential effort thrombosis or Paget Schroeder syndrome.  Discussed with Jannifer Hick, PA-C and Dr. Merlyn Albert, [place], [place] 06/16/23 1626

## 2023-06-16 NOTE — Progress Notes (Addendum)
ANTICOAGULATION CONSULT NOTE  Pharmacy Consult for Heparin Indication: DVT  Allergies  Allergen Reactions   Penicillins     Patient Measurements: Height: 5' 5.5" (166.4 cm) Weight: 64.4 kg (142 lb) IBW/kg (Calculated) : 58.15 Heparin Dosing Weight: 64 kg  Vital Signs: Temp: 98.8 F (37.1 C) (07/28 2051) Temp Source: Oral (07/28 2051) BP: 112/68 (07/28 2135) Pulse Rate: 78 (07/28 2135)  Labs: Recent Labs    06/16/23 1649  HGB 13.7  HCT 40.7  PLT 258  APTT 31  LABPROT 14.6  INR 1.1  CREATININE 0.85    Estimated Creatinine Clearance: 74.4 mL/min (by C-G formula based on SCr of 0.85 mg/dL).   Medical History: History reviewed. No pertinent past medical history.   Assessment: 25 YOF admitted for acute DVT of left subclavian vein, left axillary vein, and left brachial vein. Not on anticoagulation PTA. Pharmacy consulted to dose heparin.Originally heparin started at 1100 units/hr, now reduced to 800 units/hr s/p IR for cath directed lysis. Alteplase infusing until repeat angiogram. No heparin boluses given in IR   Goal of Therapy:  Heparin level 0.2-0.5 units/mL  Monitor platelets by anticoagulation protocol: Yes   Plan:  Heparin 800 units/hr per MD (ok for pharmacy to adjust) Check heparin level in 6 hours along with CBC and fibrinogen Monitor for signs and symptoms of bleeding F/u long-term AC plans  Thank you for involving pharmacy in the patient's care.   Theotis Burrow, PharmD PGY1 Acute Care Pharmacy Resident  06/16/2023 10:14 PM

## 2023-06-16 NOTE — Progress Notes (Addendum)
Patient off unit at 2050 hrs for IR. Handoff given to Tammy Sours, Charity fundraiser.   Edit: patient returned to unit at 2208 hrs. Hand-off received from [place], [place].

## 2023-06-16 NOTE — Plan of Care (Signed)
Patient admitted to MICU overnight with a diagnosis of "DVT" of LUE. Patient arrives to MICU on continuous infusions of Heparin and NS via PIV. No supplemental O2. LUE is cool, very slightly discolored purple. Patient endorses pain to LUE stating "it's there" and mild paresthesia in the left fingers. Radial and Ulnar pulses +2. No obvious decrease in motor strength in LUE. Pre-procedural CHG bath is completed and MRSA PCR sent to lab. Patient is then transported to IR for potential lysis catheter placement.   Problem: Education: Goal: Knowledge of General Education information will improve Description: Including pain rating scale, medication(s)/side effects and non-pharmacologic comfort measures Outcome: Progressing   Problem: Health Behavior/Discharge Planning: Goal: Ability to manage health-related needs will improve Outcome: Progressing   Problem: Clinical Measurements: Goal: Ability to maintain clinical measurements within normal limits will improve Outcome: Progressing Goal: Will remain free from infection Outcome: Progressing Goal: Diagnostic test results will improve Outcome: Progressing Goal: Respiratory complications will improve Outcome: Progressing Goal: Cardiovascular complication will be avoided Outcome: Progressing   Problem: Activity: Goal: Risk for activity intolerance will decrease Outcome: Progressing   Problem: Nutrition: Goal: Adequate nutrition will be maintained Outcome: Progressing   Problem: Coping: Goal: Level of anxiety will decrease Outcome: Progressing   Problem: Elimination: Goal: Will not experience complications related to bowel motility Outcome: Progressing Goal: Will not experience complications related to urinary retention Outcome: Progressing   Problem: Pain Managment: Goal: General experience of comfort will improve Outcome: Progressing   Problem: Safety: Goal: Ability to remain free from injury will improve Outcome: Progressing    Problem: Skin Integrity: Goal: Risk for impaired skin integrity will decrease Outcome: Progressing

## 2023-06-16 NOTE — ED Notes (Addendum)
Vascular Physician at bedside.

## 2023-06-16 NOTE — Progress Notes (Signed)
ANTICOAGULATION CONSULT NOTE  Pharmacy Consult for Heparin Indication: DVT  Allergies  Allergen Reactions   Penicillins     Patient Measurements: Height: 5' 5.5" (166.4 cm) Weight: 64.4 kg (142 lb) IBW/kg (Calculated) : 58.15 Heparin Dosing Weight: 64 kg  Vital Signs: Temp: 98.2 F (36.8 C) (07/28 1618) Temp Source: Oral (07/28 1618) BP: 123/91 (07/28 1618) Pulse Rate: 75 (07/28 1618)  Labs: Recent Labs    06/16/23 1649  HGB 13.7  HCT 40.7  PLT 258  APTT 31  LABPROT 14.6  INR 1.1  CREATININE 0.85    Estimated Creatinine Clearance: 74.4 mL/min (by C-G formula based on SCr of 0.85 mg/dL).   Medical History: History reviewed. No pertinent past medical history.   Assessment: 1 YOF admitted for acute DVT of left subclavian vein, left axillary vein, and left brachial vein. Not on anticoagulation PTA. Pharmacy consulted to dose heparin.  Goal of Therapy:  Heparin level 0.3-0.7 units/ml Monitor platelets by anticoagulation protocol: Yes   Plan:  Heparin 5000 units IV once then heparin 1100 units/hr Check heparin level in 6 hours Monitor for signs and symptoms of bleeding F/u long-term AC plans  Eldridge Scot, PharmD Clinical Pharmacist 06/16/2023, 6:20 PM

## 2023-06-16 NOTE — Consult Note (Signed)
Chief Complaint: Left arm pain and swelling  Referring Physician(s): Dr. Criss Alvine, ED  Supervising Physician: Gilmer Mor  Patient Status: [facility] - ED  History of Present Illness: Donna Reilly is a 47 y.o. female presenting to [facility] ED for left arm pain and swelling and concern for DVT.   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  Given her symptoms, she felt she needed to be evaluated for possible DVT.    She was white-water rafting in [place] 1 week ago on the weekend, and experienced pain in the left axilla on Monday/Tuesday, concerned for a muscle injury.  The pain worsened with associated swelling, and she also has some paresthesia of the fingers.    Denies SOB or chest pain.   She has no history of stroke, brain tumor, recent surgery.  Denies GI bleeding, epistaxis.   Duplex was performed in the ED, + for DVT of the left subclavian, axillary, brachial vein.     History reviewed. No pertinent past medical history.  Past Surgical History:  Procedure Laterality Date   KNEE ARTHROSCOPY  2011    Allergies: Penicillins  Medications: Prior to Admission medications   Medication Sig Start Date End Date Taking? Authorizing Provider  albuterol (PROVENTIL HFA;VENTOLIN HFA) 108 (90 BASE) MCG/ACT inhaler Inhale 2 puffs into the lungs every 6 (six) hours as needed for wheezing. 11/07/12   Newt Lukes, MD  fluticasone (FLONASE) 50 MCG/ACT nasal spray Place 2 sprays into the nose daily. 11/07/12   Newt Lukes, MD  fluticasone (FLOVENT HFA) 110 MCG/ACT inhaler Inhale 1 puff into the lungs 2 (two) times daily. 11/07/12   Newt Lukes, MD  levonorgestrel (MIRENA) 20 MCG/24HR IUD 1 each by Intrauterine route once.    [provider]     Family History  Problem Relation Age of Onset   Lung  cancer Paternal Grandmother    Hyperlipidemia Mother    Hypertension Mother    Hyperlipidemia Father    Hypertension Father    Coronary artery disease Paternal Uncle    Cardiomyopathy Brother     Social History   Socioeconomic History   Marital status: Married    Spouse name: Not on file   Number of children: Not on file   Years of education: Not on file   Highest education level: Not on file  Occupational History   Occupation: OB-GYN    Employer: [employer]  Tobacco Use   Smoking status: Never   Smokeless tobacco: Never   Tobacco comments:    local ob-gyn, lives with spouse and 2 kids  Substance and Sexual Activity   Alcohol use: Yes   Drug use: No   Sexual activity: Not on file  Other Topics Concern   Not on file  Social History Narrative   Regular exercise-yes   Caffeine use-no\   Social Determinants of Health   Financial Resource Strain: Not on file  Food Insecurity: Not on file  Transportation Needs: Not on file  Physical Activity: Not on file  Stress: Not on file  Social Connections: Not on file       Review of Systems: A 12 point ROS discussed and pertinent positives are indicated in the HPI above.  All other systems are negative.  Review of Systems  Vital Signs: BP 111/74  Pulse 73   Temp 98.2 F (36.8 C) (Oral)   Resp 18   Ht 5' 5.5" (1.664 m)   Wt 64.4 kg   LMP  (LMP Unknown)   SpO2 100%   BMI 23.27 kg/m     Physical Exam General: 47 yo female appearing stated age.  Well-developed, well-nourished.  No distress. HEENT: Atraumatic, normocephalic.  Conjugate gaze, extra-ocular motor intact. No scleral icterus or scleral injection. No lesions on external ears, nose, lips, or gums.  Oral mucosa moist, pink.  Neck: Symmetric with no goiter enlargement.  Chest/Lungs:  Symmetric chest with inspiration/expiration.  No labored breathing.  Clear to auscultation with no wheezes, rhonchi, or rales.  Heart:  RRR, with no third heart sounds appreciated.  No JVD appreciated.  Abdomen:  Soft, NT/ND, with + bowel sounds.   Genito-urinary: Deferred Neurologic: Alert & Oriented to person, place, and time.   Normal affect and insight.  Appropriate questions.  Moving all 4 extremities with gross sensory intact.  Pulse Exam:  No bruit appreciated.  Palpable radial/ulnar pulses bilateral.  Extremities: bilateral circumference of the forearms is symmetric. The left upper arm is +3cm diameter. Mild redness.  +warmth of the left compared to right.  Imaging: DG Chest 2 View  Result Date: 06/16/2023 CLINICAL DATA:  Shortness of breath. EXAM: CHEST - 2 VIEW COMPARISON:  X-ray 09/04/2017 FINDINGS: No consolidation, pneumothorax or effusion. No edema. Normal cardiopericardial silhouette. IMPRESSION: No acute cardiopulmonary disease. Electronically Signed   By: Karen Kays M.D.   On: 06/16/2023 18:31   VAS [place] UPPER EXTREMITY VENOUS DUPLEX  Result Date: 06/16/2023 UPPER VENOUS STUDY  Patient Name:  Donna Reilly  Date of Exam:   06/16/2023 Medical Rec #: [id number]       Accession #:    [id number] Date of Birth: 04-07-1976       Patient Gender: F Patient Age:   44 years Exam Location:  [facility] Procedure:      VAS [place] UPPER EXTREMITY VENOUS DUPLEX Referring Phys: CASSANDRA LAW --------------------------------------------------------------------------------  Indications: Pain, and Swelling that began after recent white water rafting/paddling. Comparison Study: No prior study on file Performing Technologist: Sherren Kerns RVS  Examination Guidelines: A complete evaluation includes B-mode imaging, spectral Doppler, color Doppler, and power Doppler as needed of all accessible portions of each vessel. Bilateral testing is considered an integral part of a complete examination. Limited examinations for reoccurring indications may be performed as noted.  Right Findings: +----------+------------+---------+-----------+----------+-------+ RIGHT      CompressiblePhasicitySpontaneousPropertiesSummary +----------+------------+---------+-----------+----------+-------+ Subclavian               Yes       Yes                      +----------+------------+---------+-----------+----------+-------+  Left Findings: +----------+------------+---------+-----------+----------+-------+ LEFT      CompressiblePhasicitySpontaneousPropertiesSummary +----------+------------+---------+-----------+----------+-------+ IJV           Full       Yes       Yes                      +----------+------------+---------+-----------+----------+-------+ Subclavian    None       No        No                Acute  +----------+------------+---------+-----------+----------+-------+ Axillary      None       No        No  Acute  +----------+------------+---------+-----------+----------+-------+ Brachial    Partial      No        No                Acute  +----------+------------+---------+-----------+----------+-------+ Radial        Full                                          +----------+------------+---------+-----------+----------+-------+ Ulnar         Full                                          +----------+------------+---------+-----------+----------+-------+ Cephalic      Full                                          +----------+------------+---------+-----------+----------+-------+ Basilic       Full                                          +----------+------------+---------+-----------+----------+-------+  Summary:  Right: No evidence of thrombosis in the subclavian.  Left: Findings consistent with acute deep vein thrombosis involving the left subclavian vein, left axillary vein and left brachial veins in the mid to proximal upper arm and at the confluence with the axillary in the axil.  *See table(s) above for measurements and observations.  Diagnosing physician: Waverly Ferrari MD Electronically  signed by Waverly Ferrari MD on 06/16/2023 at 5:02:35 PM.    Final     Labs:  CBC: Recent Labs    06/16/23 1649  WBC 7.7  HGB 13.7  HCT 40.7  PLT 258    COAGS: Recent Labs    06/16/23 1649  INR 1.1  APTT 31    BMP: Recent Labs    06/16/23 1649  NA 136  K 3.7  CL 102  CO2 23  GLUCOSE 94  BUN 14  CALCIUM 9.7  CREATININE 0.85  GFRNONAA >60    LIVER FUNCTION TESTS: Recent Labs    06/16/23 1649  BILITOT 0.9  AST 17  ALT 12  ALKPHOS 44  PROT 8.2*  ALBUMIN 4.5    TUMOR MARKERS: No results for input(s): "AFPTM", "CEA", "CA199", "CHROMGRNA" in the last 8760 hours.  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   I had a lengthy discussion with Dr. Ernestina Penna and her husband regarding pertinent anatomy/pathophysiology, and basic treatment paradigms for upper extremity DVT.  Foundational treatment is anti-coagulation as well as compression therapy to support symptom relief.  Beyond that, catheter directed therapy is considered such as catheter lysis (tPA/tnk), mechanical thrombolysis, balloon maceration, aspiration thrombectomy, etc.    These more aggressive therapies are indicated for relief of symptoms and/or to avoid future post-thrombotic syndrome.  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    The risk benefit analysis has to do with the  risk of ongoing symptoms/worsening symptoms on conservative care/anti-coagulation versus the risk of the procedure.  Risks include; bleeding, infection, injury to vein, contrast reaction, organ injury, need for further procedure/surgery, pulmonary embolism, GI bleeding/neuro bleeding, cardiopulmonary collapse, death.   We would anticipate a low volume of tPA overnight, with the usual  infusion rate of 1mg /hr of tPA, such that we would anticipate 20mg  total.  This low volume has shown in PE lysis trials to have a nearly 0 rate of IC bleeding.  I did let her know that while there is low risk she understands that the risk is not 0.    After discussion, she would like to proceed with lysis.   Plan: - plan for catheter directed lysis left upper extremity - continue heparin ggt  Thank you for this interesting consult.  I greatly enjoyed meeting Shamia Gulling and look forward to participating in their care.  A copy of this report was sent to the requesting provider on this date.  Electronically Signed: Gilmer Mor, DO 06/16/2023, 7:42 PM   I spent a total of 80 Minutes    in face to face in clinical consultation, greater than 50% of which was counseling/coordinating care for left upper extremity DVT, possible catheter thrombolysis

## 2023-06-16 NOTE — ED Triage Notes (Signed)
Pt reports left arm pain that started on Tuesday. She had a vascular US today and was told she has a left subclavian and axillary DVT. Denies chest pain/SOB. + radial pulse.

## 2023-06-17 ENCOUNTER — Telehealth: Payer: Self-pay | Admitting: Pulmonary Disease

## 2023-06-17 ENCOUNTER — Inpatient Hospital Stay (HOSPITAL_COMMUNITY): Payer: BC Managed Care – PPO

## 2023-06-17 ENCOUNTER — Encounter (HOSPITAL_COMMUNITY): Payer: Self-pay | Admitting: Critical Care Medicine

## 2023-06-17 ENCOUNTER — Other Ambulatory Visit (HOSPITAL_COMMUNITY): Payer: Self-pay

## 2023-06-17 DIAGNOSIS — I82622 Acute embolism and thrombosis of deep veins of left upper extremity: Secondary | ICD-10-CM | POA: Diagnosis not present

## 2023-06-17 HISTORY — PX: IR THROMB F/U EVAL ART/VEN FINAL DAY (MS): IMG5379

## 2023-06-17 HISTORY — PX: IR VENO/EXT/UNI LEFT: IMG675

## 2023-06-17 LAB — CBC
HCT: 34.8 % — ABNORMAL LOW (ref 36.0–46.0)
Hemoglobin: 11.9 g/dL — ABNORMAL LOW (ref 12.0–15.0)
MCH: 30.5 pg (ref 26.0–34.0)
MCHC: 34.2 g/dL (ref 30.0–36.0)
MCV: 89.2 fL (ref 80.0–100.0)
Platelets: 202 10*3/uL (ref 150–400)
RBC: 3.9 MIL/uL (ref 3.87–5.11)
RDW: 12.1 % (ref 11.5–15.5)
WBC: 7.1 10*3/uL (ref 4.0–10.5)
nRBC: 0 % (ref 0.0–0.2)

## 2023-06-17 LAB — HEPARIN LEVEL (UNFRACTIONATED): Heparin Unfractionated: <0.1 IU/mL — ABNORMAL LOW (ref 0.30–0.70)

## 2023-06-17 LAB — FIBRINOGEN: Fibrinogen: 226 mg/dL (ref 210–475)

## 2023-06-17 MED ORDER — IOHEXOL 300 MG/ML  SOLN
50.0000 mL | Freq: Once | INTRAMUSCULAR | Status: AC | PRN
  Administered 2023-06-17: 40 mL via INTRAVENOUS

## 2023-06-17 MED ORDER — KETOROLAC TROMETHAMINE 15 MG/ML IJ SOLN: 15.0000 mg | Freq: Once | INTRAMUSCULAR | Status: DC

## 2023-06-17 MED ORDER — MIDAZOLAM HCL 2 MG/2ML IJ SOLN
INTRAMUSCULAR | Status: AC
  Filled 2023-06-17: qty 2

## 2023-06-17 MED ORDER — CYCLOBENZAPRINE HCL 5 MG PO TABS
2.5000 mg | ORAL_TABLET | Freq: Once | ORAL | Status: AC
  Administered 2023-06-17: 2.5 mg via ORAL
  Filled 2023-06-17: qty 0.5

## 2023-06-17 MED ORDER — APIXABAN 5 MG PO TABS
ORAL_TABLET | ORAL | 2 refills | Status: DC
  Filled 2023-06-17 (×2): qty 60, 23d supply, fill #0
  Filled 2023-07-08: qty 60, 30d supply, fill #1

## 2023-06-17 MED ORDER — LIDOCAINE HCL 1 % IJ SOLN
20.0000 mL | Freq: Once | INTRAMUSCULAR | Status: AC
  Administered 2023-06-17: 10 mL
  Filled 2023-06-17: qty 20

## 2023-06-17 MED ORDER — APIXABAN 5 MG PO TABS: 5.0000 mg | ORAL_TABLET | Freq: Two times a day (BID) | ORAL | Status: DC

## 2023-06-17 MED ORDER — HYDROMORPHONE HCL 1 MG/ML IJ SOLN
0.5000 mg | Freq: Once | INTRAMUSCULAR | Status: AC
  Administered 2023-06-17: 0.5 mg via INTRAVENOUS
  Filled 2023-06-17: qty 0.5

## 2023-06-17 MED ORDER — MIDAZOLAM HCL 2 MG/2ML IJ SOLN
INTRAMUSCULAR | Status: AC | PRN
Start: 2023-06-17 — End: 2023-06-17
  Administered 2023-06-17: 1 mg via INTRAVENOUS

## 2023-06-17 MED ORDER — ACETAMINOPHEN 325 MG PO TABS
650.0000 mg | ORAL_TABLET | Freq: Four times a day (QID) | ORAL | Status: DC | PRN
  Administered 2023-06-17: 650 mg via ORAL
  Filled 2023-06-17: qty 2

## 2023-06-17 MED ORDER — DEXTROSE IN LACTATED RINGERS 5 % IV SOLN: INTRAVENOUS | Status: DC

## 2023-06-17 MED ORDER — HYDROMORPHONE HCL 1 MG/ML IJ SOLN
0.5000 mg | INTRAMUSCULAR | Status: DC | PRN
  Administered 2023-06-17: 0.5 mg via INTRAVENOUS
  Filled 2023-06-17: qty 0.5

## 2023-06-17 MED ORDER — DIPHENHYDRAMINE HCL 50 MG/ML IJ SOLN
12.5000 mg | Freq: Once | INTRAMUSCULAR | Status: AC
  Administered 2023-06-17: 12.5 mg via INTRAVENOUS
  Filled 2023-06-17: qty 1

## 2023-06-17 MED ORDER — LIDOCAINE HCL 1 % IJ SOLN
INTRAMUSCULAR | Status: AC
  Filled 2023-06-17: qty 20

## 2023-06-17 MED ORDER — FENTANYL CITRATE (PF) 100 MCG/2ML IJ SOLN
INTRAMUSCULAR | Status: AC
  Filled 2023-06-17: qty 2

## 2023-06-17 MED ORDER — APIXABAN 5 MG PO TABS
10.0000 mg | ORAL_TABLET | Freq: Two times a day (BID) | ORAL | Status: DC
  Administered 2023-06-17: 10 mg via ORAL
  Filled 2023-06-17: qty 2

## 2023-06-17 MED ORDER — APIXABAN 5 MG PO TABS
ORAL_TABLET | ORAL | 2 refills | Status: DC
  Filled 2023-06-17: qty 60, 24d supply, fill #0

## 2023-06-17 MED ORDER — TRAMADOL HCL 50 MG PO TABS
50.0000 mg | ORAL_TABLET | Freq: Four times a day (QID) | ORAL | 0 refills | Status: AC | PRN
  Filled 2023-06-17: qty 20, 5d supply, fill #0

## 2023-06-17 NOTE — Discharge Summary (Signed)
Physician Discharge Summary         Patient ID: Nayara Bry MRN: [id number] DOB/AGE: 1976-05-18 47 y.o.  Admit date: 06/16/2023 Discharge date: 06/17/2023  Discharge Diagnoses:    Active Hospital Problems   Diagnosis Date Noted   DVT (deep venous thrombosis) (HCC) 06/16/2023    Resolved Hospital Problems  No resolved problems to display.      Discharge summary    47 year old female presented with left arm pain and found to have VTE of left subclavian, axillary, and brachial veins. Risk factor for subclavian VTE includes recent heavy exercise as she was on a rafting trip prior start of symptoms. Non-smoker, not on estrogen, doubt occult malignancy. She underwent catheter directed thrombolysis successfully with significant clearance of clot from veins and return of antegrade flow. Some residual narrowing of left subclavian vein remains. She was medically treated with heparin infusion prior to transition to DOAC on day of discharge. Hypercoagulable workup in process.  Discharge Plan by Active Problems    Left upper extremity VTE -Eliquis 10 mg BID x 7 days then 5 mg BID through end of October 2024 -Follow-up with [facility] planned as outpatient  Significant Hospital tests/ studies   IR Veno/Ext/Uni Left 06/17/2023  Narrative CLINICAL DATA:  Occlusive left axillary and subclavian DVT, status post overnight catheter directed thrombolysis  EXAM: LEFT UPPER EXTREMITY VENOGRAM  VENOUS MECHANICAL THROMBECTOMY  ANESTHESIA/SEDATION: Intravenous versed 1mg  were administered as conscious sedation during continuous monitoring of the patient's level of consciousness and physiological / cardiorespiratory status by the radiology RN, with a total moderate sedation time of 29 minutes.  MEDICATIONS: Lidocaine 1% subcutaneous  CONTRAST:  40mL OMNIPAQUE IOHEXOL 300 MG/ML  SOLN  PROCEDURE: The procedure, risks (including but not limited to bleeding, infection, organ damage ),  benefits, and alternatives were explained to the patient. Questions regarding the procedure were encouraged and answered. The patient understands and consents to the procedure.  The previously placed left upper extremity venous sheath prepped and draped in usual sterile fashion. Maximal barrier sterile technique was utilized including caps, mask, sterile gowns, sterile gloves, sterile drape, hand hygiene and skin antiseptic.  Contrast injection demonstrated antegrade in a venous flow through the brachial vein. DSA showed segmental regions of spasm in the central brachial vein, slow flow through the axillary and subclavian veins. Partially occlusive eccentric thrombus noted centrally in the axillary and subclavian veins. The angiographic catheter was exchanged for the Angiojet Zelante for mechanical thrombolysis of residual thrombus in the axillary and subclavian veins. Follow-up venogram showed persistent appearance of the luminal irregularity in the central subclavian vein overlying first rib. Stenosis was exacerbated with arm abduction. There was clearance of the eccentric thrombus in the axillary vein with only a small residual protruding from a branch vessel, of no hemodynamic significance.  Catheter and sheath were then removed and hemostasis achieved at the site.  The patient tolerated the procedure well.  COMPLICATIONS: None immediate  FINDINGS: Eccentric partially occlusive residual mural thrombus in the axillary vein, largely cleared with Angiojet mechanical thrombectomy device. 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  IMPRESSION: 1. Significant interval clearance of occlusive acute DVT from the axillary and subclavian veins, with additional clearance of axillary clot using mechanical thrombectomy device. 2. Persistent luminal irregularity and narrowing in the central  left subclavian vein, likely chronic. Follow-up with vascular surgery as an outpatient is planned.   Electronically Signed By: Corlis Leak M.D. On:  06/17/2023 13:53   Consults   Interventional radiology Vascular surgery  Discharge Exam: BP 98/70   Pulse 60   Temp 99.5 F (37.5 C) (Oral)   Resp 13   Ht 5' 5.5" (1.664 m)   Wt 65 kg   LMP  (LMP Unknown)   SpO2 96%   BMI 23.48 kg/m    Labs at discharge   Lab Results  Component Value Date   CREATININE 0.90 06/17/2023   BUN 10 06/17/2023   NA 136 06/17/2023   K 3.8 06/17/2023   CL 102 06/17/2023   CO2 22 06/17/2023   Lab Results  Component Value Date   WBC 7.1 06/17/2023   HGB 11.9 (L) 06/17/2023   HCT 34.8 (L) 06/17/2023   MCV 89.2 06/17/2023   PLT 202 06/17/2023   Lab Results  Component Value Date   ALT 12 06/16/2023   AST 17 06/16/2023   ALKPHOS 44 06/16/2023   BILITOT 0.9 06/16/2023   Lab Results  Component Value Date   INR 1.1 06/16/2023    Current radiological studies    IR THROMB F/U EVAL ART/VEN FINAL DAY (MS)  Result Date: 06/17/2023 CLINICAL DATA:  Occlusive left axillary and subclavian DVT, status post overnight catheter directed thrombolysis EXAM: LEFT UPPER EXTREMITY VENOGRAM VENOUS MECHANICAL THROMBECTOMY ANESTHESIA/SEDATION: Intravenous versed 1mg  were administered as conscious sedation during continuous monitoring of the patient's level of consciousness and physiological / cardiorespiratory status by the radiology RN, with a total moderate sedation time of 29 minutes. MEDICATIONS: Lidocaine 1% subcutaneous CONTRAST:  40mL OMNIPAQUE IOHEXOL 300 MG/ML  SOLN PROCEDURE: The procedure, risks (including but not limited to bleeding, infection, organ damage ), benefits, and alternatives were explained to the patient. Questions regarding the procedure were encouraged and answered. The patient understands and consents to the procedure. The previously placed left upper extremity venous sheath prepped and  draped in usual sterile fashion. Maximal barrier sterile technique was utilized including caps, mask, sterile gowns, sterile gloves, sterile drape, hand hygiene and skin antiseptic. Contrast injection demonstrated antegrade in a venous flow through the brachial vein. DSA showed segmental regions of spasm in the central brachial vein, slow flow through the axillary and subclavian veins. Partially occlusive eccentric thrombus noted centrally in the axillary and subclavian veins. The angiographic catheter was exchanged for the Angiojet Zelante for mechanical thrombolysis of residual thrombus in the axillary and subclavian veins. Follow-up venogram showed persistent appearance of the luminal irregularity in the central subclavian vein overlying first rib. Stenosis was exacerbated with arm abduction. There was clearance of the eccentric thrombus in the axillary vein with only a small residual protruding from a branch vessel, of no hemodynamic significance. Catheter and sheath were then removed and hemostasis achieved at the site. The patient tolerated the procedure well. COMPLICATIONS: None immediate FINDINGS: Eccentric partially occlusive residual mural thrombus in the axillary vein, largely cleared with Angiojet mechanical thrombectomy device. 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 IMPRESSION: 1. Significant interval clearance of occlusive acute DVT from the axillary and subclavian veins, with additional clearance of axillary clot using mechanical thrombectomy device. 2. 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 Follow-up with vascular surgery as an outpatient is planned. Electronically Signed   By: Corlis Leak M.D.   On: 06/17/2023 13:53   IR Veno/Ext/Uni Left  Result Date: 06/17/2023 CLINICAL DATA:  Occlusive left axillary and subclavian DVT, status post overnight catheter  directed  thrombolysis EXAM: LEFT UPPER EXTREMITY VENOGRAM VENOUS MECHANICAL THROMBECTOMY ANESTHESIA/SEDATION: Intravenous versed 1mg  were administered as conscious sedation during continuous monitoring of the patient's level of consciousness and physiological / cardiorespiratory status by the radiology RN, with a total moderate sedation time of 29 minutes. MEDICATIONS: Lidocaine 1% subcutaneous CONTRAST:  40mL OMNIPAQUE IOHEXOL 300 MG/ML  SOLN PROCEDURE: The procedure, risks (including but not limited to bleeding, infection, organ damage ), benefits, and alternatives were explained to the patient. Questions regarding the procedure were encouraged and answered. The patient understands and consents to the procedure. The previously placed left upper extremity venous sheath prepped and draped in usual sterile fashion. Maximal barrier sterile technique was utilized including caps, mask, sterile gowns, sterile gloves, sterile drape, hand hygiene and skin antiseptic. Contrast injection demonstrated antegrade in a venous flow through the brachial vein. DSA showed segmental regions of spasm in the central brachial vein, slow flow through the axillary and subclavian veins. Partially occlusive eccentric thrombus noted centrally in the axillary and subclavian veins. The angiographic catheter was exchanged for the Angiojet Zelante for mechanical thrombolysis of residual thrombus in the axillary and subclavian veins. Follow-up venogram showed persistent appearance of the luminal irregularity in the central subclavian vein overlying first rib. Stenosis was exacerbated with arm abduction. There was clearance of the eccentric thrombus in the axillary vein with only a small residual protruding from a branch vessel, of no hemodynamic significance. Catheter and sheath were then removed and hemostasis achieved at the site. The patient tolerated the procedure well. COMPLICATIONS: None immediate FINDINGS: Eccentric partially occlusive  residual mural thrombus in the axillary vein, largely cleared with Angiojet mechanical thrombectomy device. 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 IMPRESSION: 1. Significant interval clearance of occlusive acute DVT from the axillary and subclavian veins, with additional clearance of axillary clot using mechanical thrombectomy device. 2. 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 Follow-up with vascular surgery as an outpatient is planned. Electronically Signed   By: Corlis Leak M.D.   On: 06/17/2023 13:53   IR US Guide Vasc Access Left  Result Date: 06/16/2023 INDICATION: 47 year old female presents with left upper extremity swelling and pain, positive DVT study, effort thrombosis of left subclavian, axillary, brachial veins. She presents for thrombolysis EXAM: ULTRASOUND-GUIDED ACCESS LEFT BRACHIAL VEIN LEFT UPPER EXTREMITY VENOGRAM PLACEMENT LYTIC CATHETER FOR INITIATION OF THROMBOLYSIS COMPARISON:  NONE MEDICATIONS: None. ANESTHESIA/SEDATION: Moderate (conscious) sedation was employed during this procedure. A total of Versed 2.0 mg and Fentanyl 100 mcg was administered intravenously by the radiology nurse. Total intra-service moderate Sedation Time: 18 minutes. The patient's level of consciousness and vital signs were monitored continuously by radiology nursing throughout the procedure under my direct supervision. FLUOROSCOPY: Radiation Exposure Index (as provided by the fluoroscopic device): 7 mGy Kerma COMPLICATIONS: None TECHNIQUE: Informed written consent was obtained from the patient after a thorough discussion of the procedural risks, benefits and alternatives. All questions were addressed. Maximal Sterile Barrier Technique was utilized including caps, mask, sterile gowns, sterile gloves, sterile drape, hand hygiene and skin antiseptic. A timeout was performed  prior to the initiation of the procedure. Ultrasound survey of the left upper arm was performed with images stored and sent to PACs, confirming patency of the left brachial vein A micropuncture needle was used access the left brachial vein under ultrasound. With venous blood flow returned, and an .018 micro wire was passed through the needle, observed to enter  the brachial vein under fluoroscopy. The needle was removed, and a micropuncture sheath was placed over the wire. Contrast was injected confirming occlusion at the brachial vein. The inner dilator and wire were removed, and a stiff 035 glide wire was advanced under fluoroscopy into the subclavian vein. The 4 French sheath was removed and a standard 7 [place] vascular sheath was placed. The dilator was removed and the sheath was flushed. Repeat venogram was performed through the sheath confirming developing collateral venous drainage of the left upper extremity with occlusion of the axillary vein and the brachial vein in the upper arm. Glidewire was navigated centrally with a 5 [place] Kumpe the catheter. With the tip of the catheter in the brachiocephalic vein the wire was removed. Venogram was performed confirming patency of the central vasculature. Glidewire was used with a pull-back method for measurement of the infusion length of the catheter. We elected to use a 90 cm working length, 30 cm infusion length UniFuse catheter. Once the Glidewire was in position the Kumpe the catheter was removed and the selected UNIFUSE catheter was placed. Obturators wire was placed. Final images were stored. The catheter was secured in position. Patient was transported to her ICU room in stable condition. FINDINGS: Ultrasound demonstrates patent brachial vein above the elbow. Initial venogram confirms occlusion of the proximal brachial vein in the upper arm. Occlusion of the axillary vein subclavian vein. The brachiocephalic vein and the superior vena cava are patent.  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 This can be evaluated further with intravascular ultrasound at the time of follow up IMPRESSION: Status post ultrasound guided access left brachial vein for left upper extremity venogram confirming DVT, placement of lytic catheter for initiation of thrombolysis. Signed, Yvone Neu. Miachel Roux, RPVI [facility] Radiology Electronically Signed   By: Gilmer Mor D.O.   On: 06/16/2023 22:24   IR INFUSION THROMBOL VENOUS INITIAL (MS)  Result Date: 06/16/2023 INDICATION: 47 year old female presents with left upper extremity swelling and pain, positive DVT study, effort thrombosis of left subclavian, axillary, brachial veins. She presents for thrombolysis EXAM: ULTRASOUND-GUIDED ACCESS LEFT BRACHIAL VEIN LEFT UPPER EXTREMITY VENOGRAM PLACEMENT LYTIC CATHETER FOR INITIATION OF THROMBOLYSIS COMPARISON:  NONE MEDICATIONS: None. ANESTHESIA/SEDATION: Moderate (conscious) sedation was employed during this procedure. A total of Versed 2.0 mg and Fentanyl 100 mcg was administered intravenously by the radiology nurse. Total intra-service moderate Sedation Time: 18 minutes. The patient's level of consciousness and vital signs were monitored continuously by radiology nursing throughout the procedure under my direct supervision. FLUOROSCOPY: Radiation Exposure Index (as provided by the fluoroscopic device): 7 mGy Kerma COMPLICATIONS: None TECHNIQUE: Informed written consent was obtained from the patient after a thorough discussion of the procedural risks, benefits and alternatives. All questions were addressed. Maximal Sterile Barrier Technique was utilized including caps, mask, sterile gowns, sterile gloves, sterile drape, hand hygiene and skin  antiseptic. A timeout was performed prior to the initiation of the procedure. Ultrasound survey of the left upper arm was performed with images stored and sent to PACs, confirming patency of the left brachial vein A micropuncture needle was used access the left brachial vein under ultrasound. With venous blood flow returned, and an .018 micro wire was passed through the needle, observed to enter the brachial vein under fluoroscopy. The needle was removed, and a micropuncture sheath  was placed over the wire. Contrast was injected confirming occlusion at the brachial vein. The inner dilator and wire were removed, and a stiff 035 glide wire was advanced under fluoroscopy into the subclavian vein. The 4 French sheath was removed and a standard 7 [place] vascular sheath was placed. The dilator was removed and the sheath was flushed. Repeat venogram was performed through the sheath confirming developing collateral venous drainage of the left upper extremity with occlusion of the axillary vein and the brachial vein in the upper arm. Glidewire was navigated centrally with a 5 [place] Kumpe the catheter. With the tip of the catheter in the brachiocephalic vein the wire was removed. Venogram was performed confirming patency of the central vasculature. Glidewire was used with a pull-back method for measurement of the infusion length of the catheter. We elected to use a 90 cm working length, 30 cm infusion length UniFuse catheter. Once the Glidewire was in position the Kumpe the catheter was removed and the selected UNIFUSE catheter was placed. Obturators wire was placed. Final images were stored. The catheter was secured in position. Patient was transported to her ICU room in stable condition. FINDINGS: Ultrasound demonstrates patent brachial vein above the elbow. Initial venogram confirms occlusion of the proximal brachial vein in the upper arm. Occlusion of the axillary vein subclavian vein. The brachiocephalic vein and the  superior vena cava are patent. 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 This can be evaluated further with intravascular ultrasound at the time of follow up IMPRESSION: Status post ultrasound guided access left brachial vein for left upper extremity venogram confirming DVT, placement of lytic catheter for initiation of thrombolysis. Signed, Yvone Neu. Miachel Roux, RPVI [facility] Radiology Electronically Signed   By: Gilmer Mor D.O.   On: 06/16/2023 22:24   IR Veno/Ext/Uni Left  Result Date: 06/16/2023 INDICATION: 47 year old female presents with left upper extremity swelling and pain, positive DVT study, effort thrombosis of left subclavian, axillary, brachial veins. She presents for thrombolysis EXAM: ULTRASOUND-GUIDED ACCESS LEFT BRACHIAL VEIN LEFT UPPER EXTREMITY VENOGRAM PLACEMENT LYTIC CATHETER FOR INITIATION OF THROMBOLYSIS COMPARISON:  NONE MEDICATIONS: None. ANESTHESIA/SEDATION: Moderate (conscious) sedation was employed during this procedure. A total of Versed 2.0 mg and Fentanyl 100 mcg was administered intravenously by the radiology nurse. Total intra-service moderate Sedation Time: 18 minutes. The patient's level of consciousness and vital signs were monitored continuously by radiology nursing throughout the procedure under my direct supervision. FLUOROSCOPY: Radiation Exposure Index (as provided by the fluoroscopic device): 7 mGy Kerma COMPLICATIONS: None TECHNIQUE: Informed written consent was obtained from the patient after a thorough discussion of the procedural risks, benefits and alternatives. All questions were addressed. Maximal Sterile Barrier Technique was utilized including caps, mask, sterile gowns, sterile gloves, sterile drape, hand hygiene and skin  antiseptic. A timeout was performed prior to the initiation of the procedure. Ultrasound survey of the left upper arm was performed with images stored and sent to PACs, confirming patency of the left brachial vein A micropuncture needle was used access the left brachial vein under ultrasound. With venous blood flow returned, and an .018 micro wire was passed through the needle, observed to enter the brachial vein under fluoroscopy. The needle was removed, and a micropuncture sheath was placed over the wire. Contrast was injected confirming occlusion at the brachial vein. The inner  dilator and wire were removed, and a stiff 035 glide wire was advanced under fluoroscopy into the subclavian vein. The 4 French sheath was removed and a standard 7 [place] vascular sheath was placed. The dilator was removed and the sheath was flushed. Repeat venogram was performed through the sheath confirming developing collateral venous drainage of the left upper extremity with occlusion of the axillary vein and the brachial vein in the upper arm. Glidewire was navigated centrally with a 5 [place] Kumpe the catheter. With the tip of the catheter in the brachiocephalic vein the wire was removed. Venogram was performed confirming patency of the central vasculature. Glidewire was used with a pull-back method for measurement of the infusion length of the catheter. We elected to use a 90 cm working length, 30 cm infusion length UniFuse catheter. Once the Glidewire was in position the Kumpe the catheter was removed and the selected UNIFUSE catheter was placed. Obturators wire was placed. Final images were stored. The catheter was secured in position. Patient was transported to her ICU room in stable condition. FINDINGS: Ultrasound demonstrates patent brachial vein above the elbow. Initial venogram confirms occlusion of the proximal brachial vein in the upper arm. Occlusion of the axillary vein subclavian vein. The brachiocephalic vein and the  superior vena cava are patent. 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 This can be evaluated further with intravascular ultrasound at the time of follow up IMPRESSION: Status post ultrasound guided access left brachial vein for left upper extremity venogram confirming DVT, placement of lytic catheter for initiation of thrombolysis. Signed, Yvone Neu. Miachel Roux, RPVI [facility] Radiology Electronically Signed   By: Gilmer Mor D.O.   On: 06/16/2023 22:24   IR Angiogram Selective Each Additional Vessel  Result Date: 06/16/2023 INDICATION: 47 year old female presents with left upper extremity swelling and pain, positive DVT study, effort thrombosis of left subclavian, axillary, brachial veins. She presents for thrombolysis EXAM: ULTRASOUND-GUIDED ACCESS LEFT BRACHIAL VEIN LEFT UPPER EXTREMITY VENOGRAM PLACEMENT LYTIC CATHETER FOR INITIATION OF THROMBOLYSIS COMPARISON:  NONE MEDICATIONS: None. ANESTHESIA/SEDATION: Moderate (conscious) sedation was employed during this procedure. A total of Versed 2.0 mg and Fentanyl 100 mcg was administered intravenously by the radiology nurse. Total intra-service moderate Sedation Time: 18 minutes. The patient's level of consciousness and vital signs were monitored continuously by radiology nursing throughout the procedure under my direct supervision. FLUOROSCOPY: Radiation Exposure Index (as provided by the fluoroscopic device): 7 mGy Kerma COMPLICATIONS: None TECHNIQUE: Informed written consent was obtained from the patient after a thorough discussion of the procedural risks, benefits and alternatives. All questions were addressed. Maximal Sterile Barrier Technique was utilized including caps, mask, sterile gowns, sterile gloves, sterile  drape, hand hygiene and skin antiseptic. A timeout was performed prior to the initiation of the procedure. Ultrasound survey of the left upper arm was performed with images stored and sent to PACs, confirming patency of the left brachial vein A micropuncture needle was used access the left brachial vein under ultrasound. With venous blood flow returned, and an .018 micro wire was passed through the needle, observed to enter the brachial vein under fluoroscopy. The needle was removed, and a micropuncture sheath was placed over the wire. Contrast was injected confirming occlusion at the brachial vein. The inner dilator and wire were removed, and a stiff 035 glide wire was advanced  under fluoroscopy into the subclavian vein. The 4 French sheath was removed and a standard 7 [place] vascular sheath was placed. The dilator was removed and the sheath was flushed. Repeat venogram was performed through the sheath confirming developing collateral venous drainage of the left upper extremity with occlusion of the axillary vein and the brachial vein in the upper arm. Glidewire was navigated centrally with a 5 [place] Kumpe the catheter. With the tip of the catheter in the brachiocephalic vein the wire was removed. Venogram was performed confirming patency of the central vasculature. Glidewire was used with a pull-back method for measurement of the infusion length of the catheter. We elected to use a 90 cm working length, 30 cm infusion length UniFuse catheter. Once the Glidewire was in position the Kumpe the catheter was removed and the selected UNIFUSE catheter was placed. Obturators wire was placed. Final images were stored. The catheter was secured in position. Patient was transported to her ICU room in stable condition. FINDINGS: Ultrasound demonstrates patent brachial vein above the elbow. Initial venogram confirms occlusion of the proximal brachial vein in the upper arm. Occlusion of the axillary vein subclavian vein. The  brachiocephalic vein and the superior vena cava are patent. 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 This can be evaluated further with intravascular ultrasound at the time of follow up IMPRESSION: Status post ultrasound guided access left brachial vein for left upper extremity venogram confirming DVT, placement of lytic catheter for initiation of thrombolysis. Signed, Yvone Neu. Miachel Roux, RPVI [facility] Radiology Electronically Signed   By: Gilmer Mor D.O.   On: 06/16/2023 22:24   DG Chest 2 View  Result Date: 06/16/2023 CLINICAL DATA:  Shortness of breath. EXAM: CHEST - 2 VIEW COMPARISON:  X-ray 09/04/2017 FINDINGS: No consolidation, pneumothorax or effusion. No edema. Normal cardiopericardial silhouette. IMPRESSION: No acute cardiopulmonary disease. Electronically Signed   By: Karen Kays M.D.   On: 06/16/2023 18:31   VAS [place] UPPER EXTREMITY VENOUS DUPLEX  Result Date: 06/16/2023 UPPER VENOUS STUDY  Patient Name:  MARGAURITE STULLER  Date of Exam:   06/16/2023 Medical Rec #: [id number]       Accession #:    [id number] Date of Birth: Jan 22, 1976       Patient Gender: F Patient Age:   21 years Exam Location:  [facility] Procedure:      VAS [place] UPPER EXTREMITY VENOUS DUPLEX Referring Phys: CASSANDRA LAW --------------------------------------------------------------------------------  Indications: Pain, and Swelling that began after recent white water rafting/paddling. Comparison Study: No prior study on file Performing Technologist: Sherren Kerns RVS  Examination Guidelines: A complete evaluation includes B-mode imaging, spectral Doppler, color Doppler, and power Doppler as needed of all accessible portions of each vessel. Bilateral testing is considered an integral  part of a complete examination. Limited examinations for reoccurring indications may be performed as noted.  Right Findings: +----------+------------+---------+-----------+----------+-------+ RIGHT     CompressiblePhasicitySpontaneousPropertiesSummary +----------+------------+---------+-----------+----------+-------+ Subclavian               Yes       Yes                      +----------+------------+---------+-----------+----------+-------+  Left Findings: +----------+------------+---------+-----------+----------+-------+ LEFT      CompressiblePhasicitySpontaneousPropertiesSummary +----------+------------+---------+-----------+----------+-------+ IJV           Full  Yes       Yes                      +----------+------------+---------+-----------+----------+-------+ Subclavian    None       No        No                Acute  +----------+------------+---------+-----------+----------+-------+ Axillary      None       No        No                Acute  +----------+------------+---------+-----------+----------+-------+ Brachial    Partial      No        No                Acute  +----------+------------+---------+-----------+----------+-------+ Radial        Full                                          +----------+------------+---------+-----------+----------+-------+ Ulnar         Full                                          +----------+------------+---------+-----------+----------+-------+ Cephalic      Full                                          +----------+------------+---------+-----------+----------+-------+ Basilic       Full                                          +----------+------------+---------+-----------+----------+-------+  Summary:  Right: No evidence of thrombosis in the subclavian.  Left: Findings consistent with acute deep vein thrombosis involving the left subclavian vein, left axillary vein and left brachial veins  in the mid to proximal upper arm and at the confluence with the axillary in the axil.  *See table(s) above for measurements and observations.  Diagnosing physician: Waverly Ferrari MD Electronically signed by Waverly Ferrari MD on 06/16/2023 at 5:02:35 PM.    Final     Disposition:    Discharge disposition: 01-Home or Self Care       Discharge Instructions     Diet - low sodium heart healthy   Complete by: As directed    Increase activity slowly   Complete by: As directed        Allergies as of 06/17/2023       Reactions   Penicillins    Codeine Rash   Itching         Medication List     STOP taking these medications    ibuprofen 200 MG tablet Commonly known as: ADVIL       TAKE these medications    albuterol 108 (90 Base) MCG/ACT inhaler Commonly known as: VENTOLIN HFA Inhale 2 puffs into the lungs every 6 (six) hours as needed for wheezing.   apixaban 5 MG Tabs tablet Commonly known as: ELIQUIS Take 2 tablets (10 mg total) by mouth 2 (two) times daily for 7 days, THEN 1 tablet (  5 mg total) 2 (two) times daily. Start taking on: June 17, 2023   fluticasone 110 MCG/ACT inhaler Commonly known as: Flovent HFA Inhale 1 puff into the lungs 2 (two) times daily.   fluticasone 50 MCG/ACT nasal spray Commonly known as: FLONASE Place 2 sprays into the nose daily.   levonorgestrel 20 MCG/24HR IUD Commonly known as: MIRENA 1 each by Intrauterine route once.   traMADol 50 MG tablet Commonly known as: Ultram Take 1 tablet (50 mg total) by mouth every 6 (six) hours as needed for up to 5 days.        Discharge Condition:    good  Signed: Marrianne Mood 06/17/2023, 3:50 PM     Physician Statement:   The Patient was personally examined, the discharge assessment and plan has been personally reviewed and I agree with Dr Mclendon's assessment and plan. < 30 minutes of time have been dedicated to discharge assessment, planning and discharge  instructions.     Left upper extremity effort-induced venous thrombosis Discharge plan was coordinated with vascular and IR. She preferred apixaban for anticoagulation. Vascular recommends evaluation for thoracic outlet syndrome and possible first rib resection as an outpatient. Hypercoagulability workup has been initiated and results pending She will have outpatient follow-up with vascular and pulmonary if needed  Iesha Summerhill V. Vassie Loll MD

## 2023-06-17 NOTE — Telephone Encounter (Signed)
Posthospital follow-up with me in 2-3 months

## 2023-06-17 NOTE — Progress Notes (Signed)
ANTICOAGULATION CONSULT NOTE  Pharmacy Consult for Heparin Indication: DVT  Allergies  Allergen Reactions   Penicillins     Patient Measurements: Height: 5' 5.5" (166.4 cm) Weight: 65 kg (143 lb 4.8 oz) IBW/kg (Calculated) : 58.15 Heparin Dosing Weight: 64 kg  Vital Signs: Temp: 98.8 F (37.1 C) (07/28 2357) Temp Source: Oral (07/28 2357) BP: 108/69 (07/29 0400) Pulse Rate: 71 (07/29 0400)  Labs: Recent Labs    06/16/23 1649 06/17/23 0333  HGB 13.7 12.5  HCT 40.7 36.9  PLT 258 210  APTT 31  --   LABPROT 14.6  --   INR 1.1  --   HEPARINUNFRC  --  0.16*  CREATININE 0.85 0.90    Estimated Creatinine Clearance: 70.2 mL/min (by C-G formula based on SCr of 0.9 mg/dL).   Medical History: History reviewed. No pertinent past medical history.   Assessment: 82 YOF admitted for acute DVT of left subclavian vein, left axillary vein, and left brachial vein. Not on anticoagulation PTA. Pharmacy consulted to dose heparin.Originally heparin started at 1100 units/hr, now reduced to 800 units/hr s/p IR for cath directed lysis. Alteplase infusing until repeat angiogram. No heparin boluses given in IR   7/29 AM update:  Heparin level sub-therapeutic  Alteplase infusing  Goal of Therapy:  Heparin level 0.2-0.5 units/mL  Monitor platelets by anticoagulation protocol: Yes   Plan:  Inc heparin to 900 units/hr Heparin level in 6 hours  Thank you for involving pharmacy in the patient's care.  Abran Duke, PharmD, BCPS Clinical Pharmacist Phone: [phone]

## 2023-06-17 NOTE — TOC Benefit Eligibility Note (Signed)
Pharmacy Patient Advocate Encounter  Insurance verification completed.    The patient is insured through {INSRESPONSE:22218}    Ran test claim for {Medication:29728} and the current 30 day co-pay is ***.   This test claim was processed through [facility]- copay amounts may vary at other pharmacies due to pharmacy/plan contracts, or as the patient moves through the different stages of their insurance plan.

## 2023-06-17 NOTE — H&P (Signed)
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  1 week ago, she was in [place] when she first appreciated symptoms.  The arm continued to swell and pain did not improve. Imaging demonstrated extensive deep venous thrombosis involving the left subclavian vein, axillary vein, brachial vein.  She is now status post lysis form for an activity with recanalization of the left-sided venous system.  This was completed by interventional radiology.  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   On exam, Lyrica was doing well, accompanied by her husband.  Patient has had significantly less pain in the left upper extremity status post lytic catheter removal.  She notes that her shoulder and upper arm remain 'tight'.  She denies sensorimotor deficits.  No prior history of DVT, no prior history of malignancy. Cologaurd screening two years ago. Needs new mammogram.  Prior history of AC joint separation     History reviewed. No pertinent past medical history.  Past Surgical History:  Procedure Laterality Date   IR ANGIOGRAM SELECTIVE EACH ADDITIONAL VESSEL  06/16/2023   IR INFUSION THROMBOL VENOUS INITIAL (MS)  06/16/2023   IR THROMB F/U EVAL ART/VEN FINAL DAY (MS)  06/17/2023   IR US GUIDE VASC ACCESS LEFT  06/16/2023   IR VENO/EXT/UNI LEFT  06/16/2023   IR VENO/EXT/UNI LEFT  06/17/2023   KNEE ARTHROSCOPY  2011    Allergies  Allergen Reactions   Penicillins    Codeine Rash    Itching     Prior to Admission medications   Medication Sig Start Date End Date Taking? Authorizing Provider  ibuprofen (ADVIL) 200 MG tablet Take 200 mg by mouth every 6 (six)  hours as needed for fever or mild pain.   Yes [provider]  levonorgestrel (MIRENA) 20 MCG/24HR IUD 1 each by Intrauterine route once.   Yes [provider]  traMADol (ULTRAM) 50 MG tablet Take 1 tablet (50 mg total) by mouth every 6 (six) hours as needed for up to 5 days. 06/17/23 06/22/23 Yes Marrianne Mood, MD  albuterol (PROVENTIL HFA;VENTOLIN HFA) 108 (90 BASE) MCG/ACT inhaler Inhale 2 puffs into the lungs every 6 (six) hours as needed for wheezing. Patient not taking: Reported on 06/17/2023 11/07/12   Newt Lukes, MD  apixaban (ELIQUIS) 5 MG TABS tablet Take 2 tablets (10 mg total) by mouth 2 (two) times daily for 7 days, THEN 1 tablet (5 mg total) 2 (two) times daily. 06/17/23 09/16/23  Marrianne Mood, MD  fluticasone (FLONASE) 50 MCG/ACT nasal spray Place 2 sprays into the nose daily. Patient not taking: Reported on 06/17/2023 11/07/12   Newt Lukes, MD  fluticasone (FLOVENT HFA) 110 MCG/ACT inhaler Inhale 1 puff into the lungs 2 (two) times daily. Patient not taking: Reported on 06/17/2023 11/07/12   Newt Lukes, MD    Social History   Socioeconomic History   Marital status: Married    Spouse name: Not on file   Number of children: Not on file   Years of education: Not on file   Highest education level: Not on file  Occupational History   Occupation: OB-GYN    Employer: [employer]  Tobacco Use   Smoking  status: Never   Smokeless tobacco: Never   Tobacco comments:    local ob-gyn, lives with spouse and 2 kids  Substance and Sexual Activity   Alcohol use: Yes   Drug use: No   Sexual activity: Not on file  Other Topics Concern   Not on file  Social History Narrative   Regular exercise-yes   Caffeine use-no\   Social Determinants of Health   Financial Resource Strain: Not on file  Food Insecurity: No Food Insecurity (06/16/2023)   Hunger Vital Sign    Worried About Running Out of Food in the Last Year: Never true    Ran Out of  Food in the Last Year: Never true  Transportation Needs: No Transportation Needs (06/16/2023)   PRAPARE - Administrator, Civil Service (Medical): No    Lack of Transportation (Non-Medical): No  Physical Activity: Not on file  Stress: Not on file  Social Connections: Not on file  Intimate Partner Violence: Not At Risk (06/16/2023)   Humiliation, Afraid, Rape, and Kick questionnaire    Fear of Current or Ex-Partner: No    Emotionally Abused: No    Physically Abused: No    Sexually Abused: No   Family History  Problem Relation Age of Onset   Lung cancer Paternal Grandmother    Hyperlipidemia Mother    Hypertension Mother    Hyperlipidemia Father    Hypertension Father    Coronary artery disease Paternal Uncle    Cardiomyopathy Brother     ROS: Otherwise negative unless mentioned in HPI  Physical Examination  Vitals:   06/17/23 1400 06/17/23 1415  BP: 98/70   Pulse: 60 60  Resp: 14 13  Temp:    SpO2: 96% 96%   Body mass index is 23.48 kg/m.  General:  WDWN in NAD Gait: Not observed HENT: WNL, normocephalic Pulmonary: normal non-labored breathing, without Rales, rhonchi,  wheezing Cardiac: regular Abdomen: soft, NT/ND, no masses Skin: without rashes Vascular Exam/Pulses: 2+ radial pulses left wrist Extremities: without ischemic changes, without Gangrene , without cellulitis; without open wounds;  Musculoskeletal: no muscle wasting or atrophy  Neurologic: A&O X 3;  No focal weakness or paresthesias are detected; speech is fluent/normal Psychiatric:  The pt has Normal affect. Lymph:  Unremarkable  CBC    Component Value Date/Time   WBC 7.1 06/17/2023 1234   RBC 3.90 06/17/2023 1234   HGB 11.9 (L) 06/17/2023 1234   HCT 34.8 (L) 06/17/2023 1234   PLT 202 06/17/2023 1234   MCV 89.2 06/17/2023 1234   MCH 30.5 06/17/2023 1234   MCHC 34.2 06/17/2023 1234   RDW 12.1 06/17/2023 1234   LYMPHSABS 2.0 06/16/2023 1649   MONOABS 0.6 06/16/2023 1649   EOSABS  0.0 06/16/2023 1649   BASOSABS 0.0 06/16/2023 1649    BMET    Component Value Date/Time   NA 136 06/17/2023 0333   NA 140 02/14/2012 0000   K 3.8 06/17/2023 0333   CL 102 06/17/2023 0333   CO2 22 06/17/2023 0333   GLUCOSE 81 06/17/2023 0333   BUN 10 06/17/2023 0333   BUN 15 02/14/2012 0000   CREATININE 0.90 06/17/2023 0333   CALCIUM 8.9 06/17/2023 0333   GFRNONAA >60 06/17/2023 0333    COAGS: Lab Results  Component Value Date   INR 1.1 06/16/2023    ASSESSMENT/PLAN: This is a 47 y.o. female with recent left upper extremity effort-induced venous thrombosis.  Imaging was reviewed, and there was compression at the thoracic outlet.  I had a long conversation with Tresa Endo and her husband regarding the above, namely the diagnosis of Paget Schroeder syndrome, and neck steps in her care pathway.  We discussed in an effort to decrease the compression in the thoracic outlet, Monsserrat would benefit from first rib resection.  I personally know Dr. Ernestina Penna, and therefore discussed her case with my partners. Dr. Ernestina Penna asked her for referral to [facility] specifically to Dr. Dennison Bulla who is a world expert in the procedure.  I think this is very reasonable. Will set up her referral.   She was asked to continue anticoagulation Update all cancer screenings Call should any questions or concerns arise  Fara Olden MD MS [facility] [phone] 06/17/2023  3:51 PM

## 2023-06-17 NOTE — Procedures (Signed)
  Procedure:  LUE venogram, mechanical thrombolysis    Preprocedure diagnosis: The encounter diagnosis was Acute deep vein thrombosis (DVT) of axillary vein of left upper extremity (HCC). Postprocedure diagnosis: same EBL:    minimal Complications:   none immediate  Signicant clearance of DVT with return of antegrade flow. Residual axillary and subclavian clot cleared with angiojet.  Residual narrowing and irregularity of central L SCV likely chronic. See full dictation in [facility].  Recommend: D/c thrombolytics Convert from heparin to xarelto. F/U with vasc surg  D. Oley Balm MD Main # [phone] Pager  [phone] Mobile [phone]

## 2023-06-17 NOTE — H&P (Signed)
NAME:  Donna Reilly, MRN:  [id number], DOB:  October 06, 1976, LOS: 1 ADMISSION DATE:  06/16/2023, CONSULTATION DATE:  7/28 REFERRING MD:  Dr. 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  Patient had left axillary and left arm pain on 7/23. Over a week ago patient went on white water rafting trip in [place]. Patient denies any chest pain or SOB. Denies any hx of DVT, cancer, recent surgeries, trauma/injuries, tobacco abuse. Has mirena IUD for birth control. Patient began to have increased swelling of arm and began having tingling in fingers. 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  Patient came to [facility] ED on 7/28 for concern for DVT. BP stable and sats 100% on room air. HR 70s. Beta hCG negative. Patient with some swelling in LUE. DVT US showing dvt in left subclavian, axillary, brachial veins. IR consulted w/ plan for catheter directed lysis. PCCM consulted for icu admission  Pertinent  Medical History  History reviewed. No pertinent past medical history.   Significant Hospital Events: Including procedures, antibiotic start and stop dates in addition to other pertinent events   7/28 left UE dvt; IR to place catheter directed lysis  Interim History / Subjective:   Complains of forearm tightness, some pain left upper arm and back of neck  Objective   Blood pressure 128/80, pulse 78, temperature 99.5 F (37.5 C), temperature source Oral, resp. rate 16, height 5' 5.5" (1.664 m), weight 65 kg, SpO2 98%.        Intake/Output Summary (Last 24 hours) at 06/17/2023 1233 Last data filed at 06/17/2023 0930 Gross per 24 hour  Intake 1654.85 ml  Output 650 ml  Net 1004.85 ml   Filed Weights   06/16/23 1618 06/17/23 0100  Weight: 64.4 kg 65 kg    Examination: General: NAD HEENT: MM pink/moist Neuro: Aox3; MAE CV: s1s2, RRR, no m/r/g PULM: Room air, no accessory  muscle use, clear breath sounds bilateral GI: soft, bsx4 active  Extremities: warm/dry, LUE swelling , some tightness left forearm, good radial pulse Skin: no rashes or lesions    Resolved Hospital Problem list     Assessment & Plan:  LUE DVT -only risk factor is recent heavy exercise; f/u hypercoagulable panel Status post catheter directed tPA Repeat angiogram shows significant clearance of DVT with return of flow, residual axillary and subclavian clot was cleared, there is residual narrowing of central left subclavian vein?  Chronic Plan: -heparin gtt -can convert to DOAC versus Lovenox -follow up hypercoagulable panel: factor V leiden, lupus anticoagulant, homocysteine, antithrombin III, protein C, protein S     Best Practice (right click and "Reselect all SmartList Selections" daily)   Diet/type: NPO DVT prophylaxis: systemic heparin; catheter directed TPA GI prophylaxis: N/A Lines: N/A Foley:  N/A Code Status:  full code Last date of multidisciplinary goals of care discussion [7/29 updated patient and husband at bedside]  Labs   CBC: Recent Labs  Lab 06/16/23 1649 06/17/23 0333  WBC 7.7 7.5  NEUTROABS 5.0  --   HGB 13.7 12.5  HCT 40.7 36.9  MCV 93.6 93.2  PLT 258 210    Basic Metabolic Panel: Recent Labs  Lab 06/16/23 1649 06/17/23 0333  NA 136 136  K 3.7 3.8  CL 102 102  CO2 23 22  GLUCOSE 94 81  BUN 14 10  CREATININE 0.85 0.90  CALCIUM 9.7 8.9  MG  --  1.8  PHOS  --  3.8   GFR: Estimated Creatinine Clearance: 70.2 mL/min (by C-G formula based on SCr of 0.9 mg/dL). Recent Labs  Lab 06/16/23 1649 06/17/23 0333  WBC 7.7 7.5    Liver Function Tests: Recent Labs  Lab 06/16/23 1649  AST 17  ALT 12  ALKPHOS 44  BILITOT 0.9  PROT 8.2*  ALBUMIN 4.5   No results for input(s): "LIPASE", "AMYLASE" in the last 168 hours. No results for input(s): "AMMONIA" in the last 168 hours.  ABG No results found for: "PHART", "PCO2ART", "PO2ART",  "HCO3", "TCO2", "ACIDBASEDEF", "O2SAT"   Coagulation Profile: Recent Labs  Lab 06/16/23 1649  INR 1.1    Cardiac Enzymes: No results for input(s): "CKTOTAL", "CKMB", "CKMBINDEX", "TROPONINI" in the last 168 hours.  HbA1C: No results found for: "HGBA1C"  CBG: No results for input(s): "GLUCAP" in the last 168 hours.   Cyril Mourning MD. Tonny Bollman. [facility] Pager : [phone]  If no response to pager , please call [phone] until 7 pm After 7:00 pm call Elink  [phone]   06/17/2023

## 2023-06-17 NOTE — Discharge Instructions (Signed)
Information on my medicine - ELIQUIS (apixaban)  This medication education was reviewed with me or my healthcare representative as part of my discharge preparation.  Why was Eliquis prescribed for you? Eliquis was prescribed to treat blood clots that may have been found in the veins of your legs (deep vein thrombosis) or in your lungs (pulmonary embolism) and to reduce the risk of them occurring again.  What do You need to know about Eliquis ? The starting dose is 10 mg (two 5 mg tablets) taken TWICE daily for the FIRST SEVEN (7) DAYS, then on (enter date)  06/24/2023  the dose is reduced to ONE 5 mg tablet taken TWICE daily.  Eliquis may be taken with or without food.   Try to take the dose about the same time in the morning and in the evening. If you have difficulty swallowing the tablet whole please discuss with your pharmacist how to take the medication safely.  Take Eliquis exactly as prescribed and DO NOT stop taking Eliquis without talking to the doctor who prescribed the medication.  Stopping may increase your risk of developing a new blood clot.  Refill your prescription before you run out.  After discharge, you should have regular check-up appointments with your healthcare provider that is prescribing your Eliquis.    What do you do if you miss a dose? If a dose of ELIQUIS is not taken at the scheduled time, take it as soon as possible on the same day and twice-daily administration should be resumed. The dose should not be doubled to make up for a missed dose.  Important Safety Information A possible side effect of Eliquis is bleeding. You should call your healthcare provider right away if you experience any of the following: Bleeding from an injury or your nose that does not stop. Unusual colored urine (red or dark brown) or unusual colored stools (red or black). Unusual bruising for unknown reasons. A serious fall or if you hit your head (even if there is no  bleeding).  Some medicines may interact with Eliquis and might increase your risk of bleeding or clotting while on Eliquis. To help avoid this, consult your healthcare provider or pharmacist prior to using any new prescription or non-prescription medications, including herbals, vitamins, non-steroidal anti-inflammatory drugs (NSAIDs) and supplements.  This website has more information on Eliquis (apixaban): http://[email or web address].com/eliquis/home

## 2023-06-17 NOTE — Progress Notes (Addendum)
ANTICOAGULATION CONSULT NOTE  Pharmacy Consult for Heparin Indication: DVT  Allergies  Allergen Reactions   Penicillins    Codeine Rash    Itching     Patient Measurements: Height: 5' 5.5" (166.4 cm) Weight: 65 kg (143 lb 4.8 oz) IBW/kg (Calculated) : 58.15 Heparin Dosing Weight: 64 kg  Vital Signs: Temp: 99.5 F (37.5 C) (07/29 0742) Temp Source: Oral (07/29 0742) BP: 98/70 (07/29 1400) Pulse Rate: 60 (07/29 1415)  Labs: Recent Labs    06/16/23 1649 06/17/23 0333 06/17/23 1234  HGB 13.7 12.5 11.9*  HCT 40.7 36.9 34.8*  PLT 258 210 202  APTT 31  --   --   LABPROT 14.6  --   --   INR 1.1  --   --   HEPARINUNFRC  --  0.16* <0.10*  CREATININE 0.85 0.90  --     Estimated Creatinine Clearance: 70.2 mL/min (by C-G formula based on SCr of 0.9 mg/dL).   Medical History: History reviewed. No pertinent past medical history.   Assessment: 68 YOF admitted for acute DVT of left subclavian vein, left axillary vein, and left brachial vein. Not on anticoagulation PTA. Pharmacy consulted to dose heparin. S/p IR for catheter-directed lysis 7/28. Alteplase infusing until repeat angiogram.  7/30 update: heparin level resulted undetectable. Alteplase therapy completed s/p return to IR today. Per RN discussion with IR, heparin remained on throughout the procedure. Hg down a bit to 11.9, plt WNL. No bleeding or issues with infusion reported per discussion with RN.  Goal of Therapy:  Heparin level 0.3-0.7 units/mL  Monitor platelets by anticoagulation protocol: Yes   Plan:  No bolus with recent IR procedure Increase heparin to 1150 units/hr Check 6hr heparin level Monitor daily CBC, s/sx bleeding F/u plans for long-term anticoagulation   Leia Alf, PharmD, BCPS Please check AMION for all [facility] contact numbers Clinical Pharmacist 06/17/2023 2:36 PM   ADDENDUM - Pharmacy consulted to transition heparin to apixaban. No bleed issues reported.  Plan: Stop  heparin infusion at time of 1st dose of apixaban - communicated plan with RN Apixaban 10mg  PO BID x 7 days; then 5mg  PO BID Monitor CBC, s/sx bleeding   Leia Alf, PharmD, BCPS Please check AMION for all [facility] contact numbers Clinical Pharmacist 06/17/2023 3:12 PM

## 2023-06-17 NOTE — Progress Notes (Signed)
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 possible that we will have prolonged use of X-ray fluoroscopy.  Potential radiation risks to you include (but are not limited to) the following: - A slightly elevated risk for cancer  several years later in life. This risk is typically less than 0.5% percent. This risk is low in comparison to the normal incidence of human cancer, which is 33% for women and 50% for men according to the American Cancer Society. - Radiation induced injury can include skin redness, resembling a rash, tissue breakdown / ulcers and hair loss (which can be temporary or permanent).  The likelihood of either of these occurring depends on the difficulty of the procedure and whether you are sensitive to radiation due to previous procedures, disease, or genetic conditions.  IF your procedure requires a prolonged use of radiation, you will be notified and given written instructions for further action.  It is your responsibility to monitor the irradiated area for the 2 weeks following the procedure and to notify your physician if  you are concerned that you have suffered a radiation induced injury.    All of the patient's questions were answered, patient is agreeable to proceed.  Consent signed and in chart.  Electronically Signed: Villa Herb, PA-C 06/17/2023,  9:14 AM   I spent a total of 15 Minutes at the the patient's bedside AND on the patient's hospital floor or unit, greater than 50% of which was counseling/coordinating care for left upper extremity DVT.

## 2023-06-17 NOTE — TOC Benefit Eligibility Note (Signed)
Pharmacy Patient Advocate Encounter  Insurance verification completed.    The patient is insured through [employer]/ [employer]    Ran test claim for Eliquis and the current 30 day co-pay is 47.00.  Ran test claim for Marcelline Deist and the current 30 day co-pay is 47.00.  This test claim was processed through [facility]- copay amounts may vary at other pharmacies due to pharmacy/plan contracts, or as the patient moves through the different stages of their insurance plan.

## 2023-06-17 NOTE — Progress Notes (Signed)
Pt discharged with husband via car. Pt educated on discharge instructions with all questions answered. All belongings sent home with patient.

## 2023-06-18 LAB — HOMOCYSTEINE: Homocysteine: 13.9 umol/L (ref 0.0–14.5)

## 2023-06-18 LAB — CARDIOLIPIN ANTIBODIES, IGG, IGM, IGA
Anticardiolipin IgA: <9 U/mL (ref 0–11)
Anticardiolipin IgG: <9 GPL U/mL (ref 0–14)
Anticardiolipin IgM: <9 [MPL'U]/mL (ref 0–12)

## 2023-06-19 NOTE — Telephone Encounter (Signed)
Called patient. She states she has a lot going on right now and wants to wait to schedule hfu. Advises she has upcoming apt with pcp. Routing to Dr. Vassie Loll as an Lorain Childes

## 2023-06-20 ENCOUNTER — Telehealth: Payer: Self-pay | Admitting: Vascular Surgery

## 2023-06-20 NOTE — Telephone Encounter (Signed)
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  She needs an office visit in 1 month in our office to evaluate for first rib resection.  This would have to be with one of the physicians who does first rib resections.  Thank you. CD

## 2023-06-27 ENCOUNTER — Telehealth: Payer: Self-pay | Admitting: Vascular Surgery

## 2023-06-27 NOTE — Telephone Encounter (Signed)
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  She needs an office visit in 1 month in our office to evaluate for first rib resection.  This would have to be with one of the physicians who does first rib resections.  Thank you. CD

## 2023-07-04 ENCOUNTER — Other Ambulatory Visit (HOSPITAL_COMMUNITY): Payer: Self-pay

## 2023-07-04 MED ORDER — ENOXAPARIN SODIUM 60 MG/0.6ML IJ SOSY
60.0000 mg | PREFILLED_SYRINGE | Freq: Two times a day (BID) | INTRAMUSCULAR | 0 refills | Status: DC
  Filled 2023-07-04: qty 7.2, 6d supply, fill #0

## 2023-07-08 ENCOUNTER — Other Ambulatory Visit (HOSPITAL_COMMUNITY): Payer: Self-pay

## 2023-07-11 ENCOUNTER — Other Ambulatory Visit (HOSPITAL_COMMUNITY): Payer: Self-pay

## 2023-07-11 MED ORDER — FAMOTIDINE 40 MG PO TABS
40.0000 mg | ORAL_TABLET | Freq: Every day | ORAL | 3 refills | Status: AC
  Filled 2023-07-11: qty 90, 90d supply, fill #0

## 2023-07-11 MED ORDER — OMEPRAZOLE 40 MG PO CPDR
40.0000 mg | DELAYED_RELEASE_CAPSULE | Freq: Every day | ORAL | 1 refills | Status: DC
  Filled 2023-07-11: qty 90, 90d supply, fill #0

## 2023-07-16 ENCOUNTER — Other Ambulatory Visit (HOSPITAL_COMMUNITY): Payer: Self-pay

## 2023-07-16 MED ORDER — TRAMADOL HCL 50 MG PO TABS
50.0000 mg | ORAL_TABLET | Freq: Four times a day (QID) | ORAL | 0 refills | Status: DC | PRN
  Filled 2023-07-16: qty 28, 7d supply, fill #0

## 2023-07-16 MED ORDER — HYDROMORPHONE HCL 2 MG PO TABS
2.0000 mg | ORAL_TABLET | ORAL | 0 refills | Status: AC | PRN
  Filled 2023-07-16: qty 40, 4d supply, fill #0

## 2023-07-16 MED ORDER — ACETAMINOPHEN 500 MG PO TABS
ORAL_TABLET | ORAL | 0 refills | Status: DC
  Filled 2023-07-16: qty 56, 7d supply, fill #0

## 2023-07-16 MED ORDER — ELIQUIS 5 MG PO TABS
ORAL_TABLET | ORAL | 0 refills | Status: DC
  Filled 2023-07-16: qty 180, 90d supply, fill #0

## 2023-07-16 MED ORDER — ENOXAPARIN SODIUM 60 MG/0.6ML IJ SOSY
PREFILLED_SYRINGE | INTRAMUSCULAR | 0 refills | Status: DC
  Filled 2023-07-16: qty 3.6, 3d supply, fill #0

## 2023-07-16 MED ORDER — ONDANSETRON 4 MG PO TBDP
4.0000 mg | ORAL_TABLET | Freq: Four times a day (QID) | ORAL | 0 refills | Status: DC | PRN
  Filled 2023-07-16: qty 18, 21d supply, fill #0

## 2023-07-16 MED ORDER — CYCLOBENZAPRINE HCL 5 MG PO TABS
5.0000 mg | ORAL_TABLET | Freq: Three times a day (TID) | ORAL | 0 refills | Status: DC | PRN
  Filled 2023-07-16: qty 30, 10d supply, fill #0

## 2023-07-16 MED ORDER — ELIQUIS 5 MG PO TABS
5.0000 mg | ORAL_TABLET | Freq: Two times a day (BID) | ORAL | 0 refills | Status: DC
  Filled 2023-07-16: qty 180, 90d supply, fill #0

## 2023-07-17 ENCOUNTER — Other Ambulatory Visit (HOSPITAL_COMMUNITY): Payer: Self-pay

## 2023-07-18 ENCOUNTER — Other Ambulatory Visit (HOSPITAL_COMMUNITY): Payer: Self-pay

## 2023-07-19 ENCOUNTER — Other Ambulatory Visit (HOSPITAL_COMMUNITY): Payer: Self-pay

## 2023-07-24 ENCOUNTER — Other Ambulatory Visit (HOSPITAL_COMMUNITY): Payer: Self-pay

## 2023-07-24 MED ORDER — METHYLPREDNISOLONE 4 MG PO TBPK
ORAL_TABLET | ORAL | 0 refills | Status: DC
  Filled 2023-07-24: qty 21, 6d supply, fill #0

## 2023-07-25 ENCOUNTER — Other Ambulatory Visit: Payer: Self-pay | Admitting: Orthopedic Surgery

## 2023-07-25 ENCOUNTER — Other Ambulatory Visit (HOSPITAL_COMMUNITY): Payer: Self-pay

## 2023-07-25 DIAGNOSIS — S143XXA Injury of brachial plexus, initial encounter: Secondary | ICD-10-CM

## 2023-07-26 ENCOUNTER — Ambulatory Visit (HOSPITAL_COMMUNITY)
Admission: RE | Admit: 2023-07-26 | Discharge: 2023-07-26 | Disposition: A | Discharge status: Home / Self Care | Payer: BC Managed Care – PPO | Source: Ambulatory Visit | Attending: Obstetrics and Gynecology

## 2023-07-26 ENCOUNTER — Other Ambulatory Visit (HOSPITAL_COMMUNITY): Payer: Self-pay | Admitting: Orthopedic Surgery

## 2023-07-26 ENCOUNTER — Other Ambulatory Visit: Payer: Self-pay | Admitting: Obstetrics and Gynecology

## 2023-07-26 DIAGNOSIS — I82622 Acute embolism and thrombosis of deep veins of left upper extremity: Secondary | ICD-10-CM | POA: Insufficient documentation

## 2023-07-26 DIAGNOSIS — E222 Syndrome of inappropriate secretion of antidiuretic hormone: Secondary | ICD-10-CM

## 2023-07-29 ENCOUNTER — Ambulatory Visit
Admission: RE | Admit: 2023-07-29 | Discharge: 2023-07-29 | Disposition: A | Discharge status: Home / Self Care | Payer: BC Managed Care – PPO | Source: Ambulatory Visit | Attending: Orthopedic Surgery | Admitting: Orthopedic Surgery

## 2023-07-29 DIAGNOSIS — S143XXA Injury of brachial plexus, initial encounter: Secondary | ICD-10-CM

## 2023-08-01 ENCOUNTER — Other Ambulatory Visit (HOSPITAL_COMMUNITY): Payer: Self-pay

## 2023-08-01 MED ORDER — ENOXAPARIN SODIUM 60 MG/0.6ML IJ SOSY
60.0000 mg | PREFILLED_SYRINGE | Freq: Two times a day (BID) | INTRAMUSCULAR | 0 refills | Status: DC
  Filled 2023-08-01: qty 18, 15d supply, fill #0

## 2023-08-02 ENCOUNTER — Other Ambulatory Visit (HOSPITAL_COMMUNITY): Payer: Self-pay

## 2023-08-07 ENCOUNTER — Encounter (HOSPITAL_COMMUNITY): Payer: Self-pay | Admitting: Pharmacist

## 2023-08-07 ENCOUNTER — Other Ambulatory Visit (HOSPITAL_COMMUNITY): Payer: Self-pay

## 2023-08-08 ENCOUNTER — Other Ambulatory Visit (HOSPITAL_COMMUNITY): Payer: Self-pay

## 2023-08-08 MED ORDER — ENOXAPARIN SODIUM 60 MG/0.6ML IJ SOSY
PREFILLED_SYRINGE | INTRAMUSCULAR | 0 refills | Status: DC
  Filled 2023-08-08 – 2023-08-12 (×3): qty 16.8, 14d supply, fill #0

## 2023-08-12 ENCOUNTER — Other Ambulatory Visit (HOSPITAL_COMMUNITY): Payer: Self-pay

## 2023-08-14 ENCOUNTER — Other Ambulatory Visit (HOSPITAL_COMMUNITY): Payer: Self-pay

## 2023-08-14 MED ORDER — GABAPENTIN 100 MG PO CAPS
ORAL_CAPSULE | ORAL | 3 refills | Status: DC
  Filled 2023-08-14: qty 270, 90d supply, fill #0

## 2023-08-28 ENCOUNTER — Other Ambulatory Visit (HOSPITAL_COMMUNITY): Payer: Self-pay

## 2023-08-28 MED ORDER — PREDNISONE 10 MG PO TABS
ORAL_TABLET | ORAL | 0 refills | Status: DC
  Filled 2023-08-28: qty 84, 24d supply, fill #0

## 2023-09-26 ENCOUNTER — Other Ambulatory Visit (HOSPITAL_COMMUNITY): Payer: Self-pay

## 2023-09-26 MED ORDER — CYCLOBENZAPRINE HCL 10 MG PO TABS
10.0000 mg | ORAL_TABLET | Freq: Every evening | ORAL | 3 refills | Status: DC
  Filled 2023-09-26: qty 90, 90d supply, fill #0

## 2023-10-07 ENCOUNTER — Other Ambulatory Visit (HOSPITAL_COMMUNITY): Payer: Self-pay

## 2023-10-08 ENCOUNTER — Other Ambulatory Visit (HOSPITAL_COMMUNITY): Payer: Self-pay

## 2023-10-09 ENCOUNTER — Other Ambulatory Visit: Payer: Self-pay

## 2023-10-09 ENCOUNTER — Other Ambulatory Visit (HOSPITAL_COMMUNITY): Payer: Self-pay

## 2023-10-09 MED ORDER — SUCRALFATE 1 GM/10ML PO SUSP
1.0000 g | Freq: Three times a day (TID) | ORAL | 3 refills | Status: DC
  Filled 2023-10-09: qty 240, 6d supply, fill #0

## 2023-10-09 MED ORDER — OMEPRAZOLE 40 MG PO CPDR
40.0000 mg | DELAYED_RELEASE_CAPSULE | Freq: Every day | ORAL | 3 refills | Status: DC
  Filled 2023-10-09: qty 90, 90d supply, fill #0

## 2023-10-09 MED ORDER — SUCRALFATE 1 G PO TABS
1.0000 g | ORAL_TABLET | Freq: Three times a day (TID) | ORAL | 3 refills | Status: DC
  Filled 2023-10-09: qty 120, 30d supply, fill #0
  Filled 2023-10-22: qty 118, 29d supply, fill #0
  Filled 2023-10-22: qty 2, 1d supply, fill #0

## 2023-10-10 ENCOUNTER — Other Ambulatory Visit (HOSPITAL_COMMUNITY): Payer: Self-pay

## 2023-10-14 ENCOUNTER — Other Ambulatory Visit (HOSPITAL_COMMUNITY): Payer: Self-pay

## 2023-10-14 MED ORDER — SUCRALFATE 1 GM/10ML PO SUSP
1.0000 g | Freq: Four times a day (QID) | ORAL | 0 refills | Status: DC
  Filled 2023-10-14 – 2023-10-22 (×2): qty 560, 14d supply, fill #0

## 2023-10-22 ENCOUNTER — Other Ambulatory Visit (HOSPITAL_COMMUNITY): Payer: Self-pay

## 2023-10-23 ENCOUNTER — Other Ambulatory Visit: Payer: Self-pay | Admitting: Family Medicine

## 2023-10-23 ENCOUNTER — Encounter: Payer: Self-pay | Admitting: Family Medicine

## 2023-10-23 ENCOUNTER — Other Ambulatory Visit (HOSPITAL_COMMUNITY): Payer: Self-pay

## 2023-10-23 DIAGNOSIS — R221 Localized swelling, mass and lump, neck: Secondary | ICD-10-CM

## 2023-10-23 MED ORDER — VOQUEZNA 20 MG PO TABS
1.0000 | ORAL_TABLET | Freq: Every day | ORAL | 2 refills | Status: DC
  Filled 2023-10-23 – 2023-11-07 (×2): qty 30, 30d supply, fill #0

## 2023-10-24 ENCOUNTER — Other Ambulatory Visit (HOSPITAL_COMMUNITY): Payer: Self-pay

## 2023-10-24 ENCOUNTER — Other Ambulatory Visit: Payer: BC Managed Care – PPO

## 2023-10-24 ENCOUNTER — Other Ambulatory Visit: Payer: Self-pay | Admitting: Family Medicine

## 2023-10-24 ENCOUNTER — Ambulatory Visit
Admission: RE | Admit: 2023-10-24 | Discharge: 2023-10-24 | Disposition: A | Discharge status: Home / Self Care | Payer: BC Managed Care – PPO | Source: Ambulatory Visit | Attending: Family Medicine | Admitting: Family Medicine

## 2023-10-24 ENCOUNTER — Encounter (HOSPITAL_COMMUNITY): Payer: Self-pay

## 2023-10-24 DIAGNOSIS — R1310 Dysphagia, unspecified: Secondary | ICD-10-CM

## 2023-10-24 DIAGNOSIS — R221 Localized swelling, mass and lump, neck: Secondary | ICD-10-CM

## 2023-10-25 ENCOUNTER — Other Ambulatory Visit (HOSPITAL_COMMUNITY): Payer: Self-pay

## 2023-10-28 ENCOUNTER — Other Ambulatory Visit (HOSPITAL_COMMUNITY): Payer: Self-pay

## 2023-11-07 ENCOUNTER — Other Ambulatory Visit (HOSPITAL_COMMUNITY): Payer: Self-pay

## 2024-01-06 ENCOUNTER — Other Ambulatory Visit (HOSPITAL_COMMUNITY): Payer: Self-pay

## 2024-01-06 ENCOUNTER — Other Ambulatory Visit: Payer: Self-pay

## 2024-01-06 MED ORDER — FLUTICASONE-SALMETEROL 250-50 MCG/ACT IN AEPB
1.0000 | INHALATION_SPRAY | Freq: Two times a day (BID) | RESPIRATORY_TRACT | 0 refills | Status: AC | PRN
  Filled 2024-01-06: qty 60, 30d supply, fill #0

## 2024-01-06 MED ORDER — ALBUTEROL SULFATE HFA 108 (90 BASE) MCG/ACT IN AERS
2.0000 | INHALATION_SPRAY | RESPIRATORY_TRACT | 1 refills | Status: DC | PRN
  Filled 2024-01-06: qty 13.4, 33d supply, fill #0

## 2024-01-06 MED ORDER — OMEPRAZOLE 40 MG PO CPDR
40.0000 mg | DELAYED_RELEASE_CAPSULE | Freq: Every day | ORAL | 1 refills | Status: DC
  Filled 2024-01-06: qty 90, 90d supply, fill #0

## 2024-01-07 ENCOUNTER — Other Ambulatory Visit (HOSPITAL_BASED_OUTPATIENT_CLINIC_OR_DEPARTMENT_OTHER): Payer: Self-pay

## 2024-01-17 ENCOUNTER — Other Ambulatory Visit (HOSPITAL_COMMUNITY): Payer: Self-pay

## 2024-02-04 ENCOUNTER — Ambulatory Visit
Admission: RE | Admit: 2024-02-04 | Discharge: 2024-02-04 | Disposition: A | Discharge status: Home / Self Care | Payer: Self-pay | Source: Ambulatory Visit | Attending: Family Medicine | Admitting: Family Medicine

## 2024-02-04 DIAGNOSIS — R1310 Dysphagia, unspecified: Secondary | ICD-10-CM

## 2024-02-14 ENCOUNTER — Other Ambulatory Visit: Payer: Self-pay

## 2024-02-17 ENCOUNTER — Other Ambulatory Visit: Payer: Self-pay | Admitting: Internal Medicine

## 2024-02-17 DIAGNOSIS — Z01818 Encounter for other preprocedural examination: Secondary | ICD-10-CM

## 2024-02-17 DIAGNOSIS — R9431 Abnormal electrocardiogram [ECG] [EKG]: Secondary | ICD-10-CM

## 2024-02-18 ENCOUNTER — Ambulatory Visit (HOSPITAL_COMMUNITY): Attending: Internal Medicine

## 2024-02-18 DIAGNOSIS — Z01818 Encounter for other preprocedural examination: Secondary | ICD-10-CM | POA: Diagnosis present

## 2024-02-18 DIAGNOSIS — R9431 Abnormal electrocardiogram [ECG] [EKG]: Secondary | ICD-10-CM | POA: Insufficient documentation

## 2024-02-18 LAB — ECHOCARDIOGRAM COMPLETE
Area-P 1/2: 3.4 cm2
S' Lateral: 2.8 cm

## 2024-02-19 ENCOUNTER — Encounter (HOSPITAL_BASED_OUTPATIENT_CLINIC_OR_DEPARTMENT_OTHER): Payer: Self-pay | Admitting: Internal Medicine

## 2024-02-21 ENCOUNTER — Ambulatory Visit (INDEPENDENT_AMBULATORY_CARE_PROVIDER_SITE_OTHER): Admitting: Internal Medicine

## 2024-02-21 VITALS — BP 122/78 | HR 80 | Ht 65.5 in | Wt 143.4 lb

## 2024-02-21 DIAGNOSIS — Z01818 Encounter for other preprocedural examination: Secondary | ICD-10-CM

## 2024-02-21 DIAGNOSIS — G4733 Obstructive sleep apnea (adult) (pediatric): Secondary | ICD-10-CM

## 2024-02-21 DIAGNOSIS — R9431 Abnormal electrocardiogram [ECG] [EKG]: Secondary | ICD-10-CM

## 2024-02-21 NOTE — Patient Instructions (Signed)
 Medication Instructions:  Your physician recommends that you continue on your current medications as directed. Please refer to the Current Medication list given to you today.  *If you need a refill on your cardiac medications before your next appointment, please call your pharmacy*  Follow-Up: At [facility], you and your health needs are our priority.  As part of our continuing mission to provide you with exceptional heart care, our providers are all part of one team.  This team includes your primary Cardiologist (physician) and Advanced Practice Providers or APPs (Physician Assistants and Nurse Practitioners) who all work together to provide you with the care you need, when you need it.  Your next appointment:   We will see you on an as needed basis   Provider:   Kirtland Bouchard [place] Hilty, MD    We recommend signing up for the patient portal called "MyChart".  Sign up information is provided on this After Visit Summary.  MyChart is used to connect with patients for Virtual Visits (Telemedicine).  Patients are able to view lab/test results, encounter notes, upcoming appointments, etc.  Non-urgent messages can be sent to your provider as well.   To learn more about what you can do with MyChart, go to [email or web address].

## 2024-02-21 NOTE — Progress Notes (Signed)
 OFFICE CONSULT NOTE  Chief Complaint:  Preoperative risk assessment  Primary Care Physician: Lewis Moccasin, MD  HPI:  Donna Reilly is a 48 y.o. female who is being seen today for the evaluation of preoperative risk at the request of Lewis Moccasin, MD. 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  Unfortunately she recently had an episode where she developed left upper extremity DVT.  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  She has been undergoing some therapy for this and is scheduled to undergo micro neurosurgery to try to further correct this condition.  As part of her workup her PCP had ordered an EKG.  She provided me with a copy that I personally reviewed that showed normal sinus rhythm, possible left atrial enlargement and possible inferior T wave changes.  To my review she had a biphasic T wave in lead III and aVF.  Based on that she was referred to cardiology for further evaluation.  She also recently had a sleep study which suggested mild obstructive sleep apnea.  She intends to follow-up on therapy for that.  Based on these recommendations and her upper extremity DVT, I suggested an echocardiogram to rule out any structural abnormalities that might explain her abnormal EKG.  Beyond this she is quite active, maintains a normal weight, eats healthy and exercises regularly.  She is completely asymptomatic without any chest pain symptoms, shortness of breath or any other limitations.  She had a an elective coronary calcium score performed on Mar 31, 2021 which was 0 indicating low risk.  Her echocardiogram was performed on Mar 19, 2024 which showed LVEF 60 to 65%, normal diastolic  function and normal global longitudinal strain.  Right heart function was normal.  No significant valvular disease.  Overall a normal study.  PMHx:  No past medical history on file.  Past Surgical History:  Procedure Laterality Date   IR ANGIOGRAM SELECTIVE EACH ADDITIONAL VESSEL  06/16/2023   IR INFUSION THROMBOL VENOUS INITIAL (MS)  06/16/2023   IR THROMB F/U EVAL ART/VEN FINAL DAY (MS)  06/17/2023   IR US GUIDE VASC ACCESS LEFT  06/16/2023   IR VENO/EXT/UNI LEFT  06/16/2023   IR VENO/EXT/UNI LEFT  06/17/2023   KNEE ARTHROSCOPY  2011    FAMHx:  Family History  Problem Relation Age of Onset   Lung cancer Paternal Grandmother    Hyperlipidemia Mother    Hypertension Mother    Hyperlipidemia Father    Hypertension Father    Coronary artery disease Paternal Uncle    Cardiomyopathy Brother     SOCHx:   reports that she has never smoked. She has never used smokeless tobacco. She reports current alcohol use. She reports that she does not use drugs.  ALLERGIES:  Allergies  Allergen Reactions   Penicillins    Codeine Rash    Itching     ROS: Pertinent items noted in HPI and remainder of comprehensive ROS otherwise negative.  HOME MEDS: Current Outpatient Medications on File Prior to Visit  Medication Sig Dispense Refill   levonorgestrel (MIRENA) 20 MCG/24HR IUD 1 each by Intrauterine route once.     omeprazole (PRILOSEC) 40 MG capsule Take 1 capsule (40 mg total) by mouth daily. 90 capsule  1   acetaminophen (TYLENOL) 500 MG tablet Take 2 tablets (1,000 mg total) by mouth every 6 (six) hours for 7 days. (Patient not taking: Reported on 02/21/2024) 56 tablet 0   albuterol (PROVENTIL HFA;VENTOLIN HFA) 108 (90 BASE) MCG/ACT inhaler Inhale 2 puffs into the lungs every 6 (six) hours as needed for wheezing. (Patient not taking: Reported on 06/17/2023) 1 Inhaler 0   albuterol (VENTOLIN HFA) 108 (90 Base) MCG/ACT inhaler Inhale 2 puffs into the lungs every 4 - 6 hours as needed for tightness,  wheezing or coughing (Patient not taking: Reported on 02/21/2024) 13.4 g 1   apixaban (ELIQUIS) 5 MG TABS tablet Take 2 tablets (10 mg total) by mouth 2 (two) times daily for 7 days, THEN 1 tablet (5 mg total) 2 (two) times daily. 60 tablet 2   ELIQUIS 5 MG TABS tablet Take as directed. Resume on 07/19/23, Hold evening of 9/8 for venogram on 9/12 (Patient not taking: Reported on 02/21/2024) 180 tablet 0   enoxaparin (LOVENOX) 60 MG/0.6ML injection Inject 0.6 mL (60 mg total) under the skin every 12 (twelve) hours for 14 days. (Patient not taking: Reported on 02/21/2024) 16.8 mL 0   famotidine (PEPCID) 40 MG tablet Take 1 tablet (40 mg total) by mouth daily for heartburn. (Patient not taking: Reported on 02/21/2024) 90 tablet 3   fluticasone (FLONASE) 50 MCG/ACT nasal spray Place 2 sprays into the nose daily. (Patient not taking: Reported on 06/17/2023) 16 g 6   fluticasone (FLOVENT HFA) 110 MCG/ACT inhaler Inhale 1 puff into the lungs 2 (two) times daily. (Patient not taking: Reported on 06/17/2023) 1 Inhaler 12   fluticasone-salmeterol (ADVAIR DISKUS) 250-50 MCG/ACT AEPB Inhale 1 puff into the lungs 2 (two) times daily for 2 weeks when needed 60 each 0   HYDROmorphone (DILAUDID) 2 MG tablet Take 1-2 tablets (2-4 mg total) by mouth every 4 (four) hours as needed (1 tab for moderate pain and 2 tabs for severe pain) for up to 5 days. (Patient not taking: Reported on 02/21/2024) 40 tablet 0   methylPREDNISolone (MEDROL DOSEPAK) 4 MG TBPK tablet Take As Directed On Package (Patient not taking: Reported on 02/21/2024) 21 tablet 0   omeprazole (PRILOSEC) 40 MG capsule Take 1 capsule (40 mg total) by mouth daily. (Patient not taking: Reported on 02/21/2024) 90 capsule 3   omeprazole (PRILOSEC) 40 MG capsule Take 1 capsule (40 mg total) by mouth daily. (Patient not taking: Reported on 02/21/2024) 90 capsule 1   ondansetron (ZOFRAN-ODT) 4 MG disintegrating tablet Dissolve 1 tablet (4 mg total) on tongue every 6 (six) hours as  needed for nausea or vomiting for up to 7 days. (Patient not taking: Reported on 02/21/2024) 28 tablet 0   predniSONE (DELTASONE) 10 MG tablet Take 6 tablets by mouth for 4 days, then 5 x 4 days then 4 x 4 days then 3 x 4 days then 2 x 4 days then 1 x 4 days then stop (Patient not taking: Reported on 02/21/2024) 84 tablet 0   sucralfate (CARAFATE) 1 g tablet Take 1 tablet (1 g total) by mouth 4 (four) times daily one hour before meals and at bedtime. (Patient not taking: Reported on 02/21/2024) 120 tablet 3   sucralfate (CARAFATE) 1 GM/10ML suspension Take 10 mLs (1 g total) by mouth 4 (four) times daily - 1 hour before meals and at bedtime on an empty stomach. (Patient not taking: Reported on 02/21/2024) 240 mL 3   sucralfate (CARAFATE) 1 GM/10ML suspension Take  10 mLs (1 g total) by mouth 4 (four) times daily before meals and at bedtime on an empty stomach (Patient not taking: Reported on 02/21/2024) 560 mL 0   traMADol (ULTRAM) 50 MG tablet Take 1 tablet (50 mg total) by mouth every 6 (six) hours as needed for severe pain (7-10) for up to 7 days. (Patient not taking: Reported on 02/21/2024) 28 tablet 0   Vonoprazan Fumarate (VOQUEZNA) 20 MG TABS Take 1 tablet by mouth daily. (Patient not taking: Reported on 02/21/2024) 30 tablet 2   [DISCONTINUED] gabapentin (NEURONTIN) 100 MG capsule Take 1 capsule (100 mg total) by mouth 3 (three) times a day. 270 capsule 3   No current facility-administered medications on file prior to visit.    LABS/IMAGING: No results found for this or any previous visit (from the past 48 hours). No results found.  LIPID PANEL:    Component Value Date/Time   CHOL 223 (A) 02/14/2012 0000   TRIG 56 02/14/2012 0000   HDL 72 (A) 02/14/2012 0000   LDLCALC 140 02/14/2012 0000    WEIGHTS: Wt Readings from Last 3 Encounters:  02/21/24 143 lb 6.4 oz (65 kg)  06/17/23 143 lb 4.8 oz (65 kg)  11/07/12 152 lb (68.9 kg)    VITALS: BP 122/78   Pulse 80   Ht 5' 5.5" (1.664 m)   Wt 143  lb 6.4 oz (65 kg)   SpO2 98%   BMI 23.50 kg/m   EXAM: General appearance: alert and no distress Lungs: clear to auscultation bilaterally Heart: regular rate and rhythm, S1, S2 normal, no murmur, click, rub or gallop Extremities: extremities normal, atraumatic, no cyanosis or edema and some acrocyanosis of the left hand and muscle wasting of the thenar eminence of the left hand  EKG: EKG Interpretation Date/Time:  Friday February 21 2024 13:44:37 EDT Ventricular Rate:  78 PR Interval:  138 QRS Duration:  70 QT Interval:  414 QTC Calculation: 471 R Axis:   93  Text Interpretation: Normal sinus rhythm Possible Left atrial enlargement Rightward axis No previous ECGs available Confirmed by Zoila Shutter [phone]) on 02/21/2024 1:49:51 PM   ASSESSMENT: Low risk for upcoming surgery Mild nonspecific EKG changes Mild OSA  PLAN: 1.   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  She is active without any cardiac symptoms.  Her EKG initially showed a biphasic T wave, but not necessarily T wave inversions which appears somewhat improved on EKG today.  There is suggestion of electrical left atrial enlargement however her atrial size is normal on echo and there are no wall motion abnormalities with normal systolic and diastolic function.  Overall she is at very low risk for surgery.  Recently she did have some mild obstructive sleep apnea on a sleep study and she plans to follow-up with CPAP therapy.  I did sign preoperative risk assessment paperwork today that she provided for her doctors in [place].  I am happy to see her back on an as-needed basis.  Chrystie Nose, MD, [facility], FACP  [place]  [facility]  Medical Director of the Advanced Lipid Disorders &  Cardiovascular Risk Reduction Clinic Diplomate of the American Board of Clinical Lipidology Attending Cardiologist  Direct Dial: [phone]  Fax: [phone]  Website:  [email or web address]  Lisette Abu Jlyn Bracamonte 02/21/2024, 4:12  PM

## 2024-03-14 ENCOUNTER — Other Ambulatory Visit (HOSPITAL_COMMUNITY): Payer: Self-pay

## 2024-03-31 ENCOUNTER — Ambulatory Visit (HOSPITAL_COMMUNITY)
Admission: RE | Admit: 2024-03-31 | Discharge: 2024-03-31 | Disposition: A | Discharge status: Home / Self Care | Source: Ambulatory Visit | Attending: Vascular Surgery | Admitting: Vascular Surgery

## 2024-03-31 ENCOUNTER — Encounter (HOSPITAL_COMMUNITY): Payer: Self-pay | Admitting: Vascular Surgery

## 2024-03-31 ENCOUNTER — Other Ambulatory Visit: Payer: Self-pay | Admitting: *Deleted

## 2024-03-31 DIAGNOSIS — M7989 Other specified soft tissue disorders: Secondary | ICD-10-CM | POA: Diagnosis present

## 2024-03-31 DIAGNOSIS — R609 Edema, unspecified: Secondary | ICD-10-CM

## 2024-04-02 ENCOUNTER — Ambulatory Visit: Attending: Vascular Surgery | Admitting: Vascular Surgery

## 2024-04-02 NOTE — Progress Notes (Signed)
 Patient called and given results of ultrasound.  No DVT.

## 2024-07-31 ENCOUNTER — Other Ambulatory Visit (HOSPITAL_BASED_OUTPATIENT_CLINIC_OR_DEPARTMENT_OTHER): Payer: Self-pay

## 2024-09-08 ENCOUNTER — Other Ambulatory Visit (HOSPITAL_BASED_OUTPATIENT_CLINIC_OR_DEPARTMENT_OTHER): Payer: Self-pay

## 2024-09-21 ENCOUNTER — Other Ambulatory Visit (HOSPITAL_COMMUNITY): Payer: Self-pay

## 2024-09-21 MED ORDER — GOLYTELY 236 G PO SOLR
ORAL | 0 refills | Status: DC
  Filled 2024-09-21: qty 4000, 1d supply, fill #0

## 2024-09-24 ENCOUNTER — Other Ambulatory Visit (HOSPITAL_COMMUNITY): Payer: Self-pay

## 2024-10-20 ENCOUNTER — Ambulatory Visit

## 2024-10-20 VITALS — BP 126/74 | HR 74 | Ht 66.0 in | Wt 143.8 lb

## 2024-10-20 DIAGNOSIS — G4733 Obstructive sleep apnea (adult) (pediatric): Secondary | ICD-10-CM

## 2024-10-20 NOTE — Progress Notes (Signed)
 Pulmonology Office Visit   Subjective:  Patient ID: Donna Reilly, female    DOB: Aug 15, 1976  MRN: [id number]  Referred by: Waylan Almarie SAUNDERS, MD  CC:  Chief Complaint  Patient presents with   Consult    Sleep- Self referral- wants to rule out OSA. Has CPAP at home but unable to use due to not sleeping well.     HPI Donna Reilly is a 49 y.o. female with DVT, allergic rhinitis and cough variant asthma.   Respective notes from provider reviewed as appropriate to gather relevant information for patient care.   Discussed the use of AI scribe software for clinical note transcription with the patient, who gave verbal consent to proceed.  History of Present Illness   Donna Reilly is a 48 year old female who presents for evaluation of sleep apnea.  She has a history of snoring, which is particularly exacerbated by alcohol consumption, and experiences multiple awakenings during the night. She attempted to use a CPAP machine but faced significant issues with mask leakage and discomfort, leading to discontinuation. There is a family history of sleep apnea, with her brothers being morbidly obese. 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  In the summer of 2024, she developed a deep vein thrombosis (DVT) in her upper extremity, extending from the brachial to the subclavian vein, following an active weekend. She underwent thrombolysis and thrombectomy. She was told by her care team that the cause of her upper extremity DVT was thoracic outlet syndrome, and she underwent a first rib resection after being presented with the option of lifelong anticoagulation or surgery. Post-surgery, she suffered a brachial plexus injury, resulting in a wrist drop and significant muscle atrophy, which led to a year-long absence from work. 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  She has a history of preeclampsia during her first pregnancy and is currently experiencing perimenopausal symptoms such as hot flashes and night sweats. She has lost approximately 10 pounds, which is about 8% of her body weight, and reports a reduction in alcohol consumption.      OSA History: Dx with mild OSA in 2024. Tried CPAP but difficult to tolerate. No significant desaturation.   PRIOR TESTS and IMAGING: PSG/HSAT:  10/20/2023: HST: AHI 4% 13, O2 nadir 84%, desaturation less than 0.5-minute.  - Obstructive sleep apnea, mild - Deep vein thrombosis (DVT) of the upper extremity - Thoracic outlet syndrome - Brachial plexus injury - Preeclampsia in first pregnancy  Surgical History: - Thrombectomy: For upper extremity DVT - First rib resection (August 2024): For thoracic outlet syndrome  Family History Copy/Paste - Brothers: Sleep apnea, Morbid obesity      10/20/2024    8:00 AM  Results of the Epworth flowsheet  Sitting and reading 1  Watching TV 1  Sitting, inactive in a public place (e.g. a theatre or a meeting) 0  As a passenger in a car for an hour without a break 0  Lying down to rest in the afternoon when circumstances permit 2  Sitting and talking to someone 0  Sitting quietly after a lunch without alcohol 0  In a car, while stopped for a few minutes in traffic 0  Total score 4    Allergies: Penicillins and Codeine  Current Outpatient Medications:    famotidine  (PEPCID ) 20 MG tablet, Take 20 mg by mouth daily., Disp: ,  Rfl:    levonorgestrel  (MIRENA , 52 MG,) 20 MCG/DAY IUD, Mirena  (52 MG), Disp: , Rfl:    montelukast (SINGULAIR) 10 MG tablet, Take 10 mg by mouth at bedtime., Disp: , Rfl:    famotidine  (PEPCID ) 40 MG tablet, Take 1 tablet (40 mg total) by mouth daily for heartburn. (Patient not taking: Reported on 02/21/2024), Disp: 90 tablet, Rfl: 3   fluticasone  (FLONASE ) 50 MCG/ACT nasal spray, Place 2 sprays into the nose daily.  (Patient not taking: Reported on 06/17/2023), Disp: 16 g, Rfl: 6   fluticasone  (FLOVENT  HFA) 110 MCG/ACT inhaler, Inhale 1 puff into the lungs 2 (two) times daily. (Patient not taking: Reported on 06/17/2023), Disp: 1 Inhaler, Rfl: 12   fluticasone -salmeterol (ADVAIR  DISKUS) 250-50 MCG/ACT AEPB, Inhale 1 puff into the lungs 2 (two) times daily for 2 weeks when needed, Disp: 60 each, Rfl: 0   HYDROmorphone  (DILAUDID ) 2 MG tablet, Take 1-2 tablets (2-4 mg total) by mouth every 4 (four) hours as needed (1 tab for moderate pain and 2 tabs for severe pain) for up to 5 days. (Patient not taking: Reported on 02/21/2024), Disp: 40 tablet, Rfl: 0   levonorgestrel  (MIRENA ) 20 MCG/24HR IUD, 1 each by Intrauterine route once., Disp: , Rfl:  No past medical history on file. Past Surgical History:  Procedure Laterality Date   IR ANGIOGRAM SELECTIVE EACH ADDITIONAL VESSEL  06/16/2023   IR INFUSION THROMBOL VENOUS INITIAL (MS)  06/16/2023   IR THROMB F/U EVAL ART/VEN FINAL DAY (MS)  06/17/2023   IR US  GUIDE VASC ACCESS LEFT  06/16/2023   IR VENO/EXT/UNI LEFT  06/16/2023   IR VENO/EXT/UNI LEFT  06/17/2023   KNEE ARTHROSCOPY  2011   Family History  Problem Relation Age of Onset   Lung cancer Paternal Grandmother    Hyperlipidemia Mother    Hypertension Mother    Hyperlipidemia Father    Hypertension Father    Coronary artery disease Paternal Uncle    Cardiomyopathy Brother    Social History   Socioeconomic History   Marital status: Married    Spouse name: Not on file   Number of children: Not on file   Years of education: Not on file   Highest education level: Not on file  Occupational History   Occupation: OB-GYN    Employer: [employer]  Tobacco Use   Smoking status: Never   Smokeless tobacco: Never   Tobacco comments:    local ob-gyn, lives with spouse and 2 kids  Substance and Sexual Activity   Alcohol use: Yes   Drug use: No   Sexual activity: Not on file  Other Topics Concern   Not on file   Social History Narrative   Regular exercise-yes   Caffeine use-no\   Social Drivers of Health   Financial Resource Strain: Low Risk  (09/22/2023)   Received from [facility]   Overall Financial Resource Strain (CARDIA)    Difficulty of Paying Living Expenses: Not hard at all  Food Insecurity: No Food Insecurity (09/22/2023)   Received from [facility]   Hunger Vital Sign    Within the past 12 months, you worried that your food would run out before you got the money to buy more.: Never true    Within the past 12 months, the food you bought just didn't last and you didn't have money to get more.: Never true  Transportation Needs: No Transportation Needs (09/22/2023)   Received from [facility]   PRAPARE -  Transportation    In the past 12 months, has lack of transportation kept you from medical appointments or from getting medications?: No    Lack of Transportation (Non-Medical): No  Physical Activity: Not on file  Stress: Not on file  Social Connections: Not on file  Intimate Partner Violence: Not At Risk (06/16/2023)   Humiliation, Afraid, Rape, and Kick questionnaire    Fear of Current or Ex-Partner: No    Emotionally Abused: No    Physically Abused: No    Sexually Abused: No       Objective:  BP 126/74   Pulse 74   Ht 5' 6 (1.676 m) Comment: per pt  Wt 143 lb 12.8 oz (65.2 kg)   SpO2 99%   BMI 23.21 kg/m  BMI Readings from Last 3 Encounters:  10/20/24 23.21 kg/m  02/21/24 23.50 kg/m  06/17/23 23.48 kg/m    Physical Exam: Physical Exam   ENT: Normal mucosa. No hypertrophy of inferior turbinates. Tonsils are normal sized. Modified Mallampati score is normal. Airway open. PULMONARY: Lungs clear to auscultation bilaterally, no adventitious breath sounds. CARDIOVASCULAR: Regular rate and rhythm, S1 S2 normal, no murmurs. ABDOMEN: Abdomen soft, nontender. Bowel sounds are normal. EXTREMITIES: No peripheral edema  noted.       Diagnostic Review:  Last metabolic panel Lab Results  Component Value Date   GLUCOSE 81 06/17/2023   NA 136 06/17/2023   K 3.8 06/17/2023   CL 102 06/17/2023   CO2 22 06/17/2023   BUN 10 06/17/2023   CREATININE 0.90 06/17/2023   GFRNONAA >60 06/17/2023   CALCIUM 8.9 06/17/2023   PHOS 3.8 06/17/2023   PROT 8.2 (H) 06/16/2023   ALBUMIN 4.5 06/16/2023   BILITOT 0.9 06/16/2023   ALKPHOS 44 06/16/2023   AST 17 06/16/2023   ALT 12 06/16/2023   ANIONGAP 12 06/17/2023         Assessment & Plan:   Assessment & Plan OSA (obstructive sleep apnea)  Orders:   Home sleep test; Future     Assessment and Plan    Mild obstructive sleep apnea Mild obstructive sleep apnea. No significant desaturation and likely not affecting long term health. Previous CPAP trials failed due to mask issues. Central sleep apnea unlikely. No significant comorbidities.  Symptoms of OSA nocturnal arousals and possibly fatigue.  - Ordered repeat home sleep study with usual sleep habits.  - Consider mandibular advancement device if CPAP intolerable. - will provide letter for disability insurance indicating mild sleep apnea with no significant long-term health impact after repeat sleep study.       No follow-ups on file.   I personally spent a total of 35 minutes in the care of the patient today including preparing to see the patient, getting/reviewing separately obtained history, performing a medically appropriate exam/evaluation, counseling and educating, placing orders, documenting clinical information in the EHR, independently interpreting results, and communicating results.   Rasheen Schewe, MD

## 2024-10-20 NOTE — Patient Instructions (Signed)
   YOUR PLAN: -MILD OBSTRUCTIVE SLEEP APNEA: Mild obstructive sleep apnea is a condition where your airway becomes partially blocked during sleep, causing breathing interruptions. This can be worsened by factors like alcohol. Since you had issues with the CPAP machine, we will conduct a repeat home sleep study to better understand your condition. If CPAP remains intolerable, we may consider a mandibular advancement device.   INSTRUCTIONS: Please complete the repeat home sleep study as ordered, ensuring you follow your usual sleep habits. Follow up with us  to discuss the results and next steps.                      Contains text generated by [employer].                                 Contains text generated by [employer].

## 2024-11-04 ENCOUNTER — Encounter

## 2024-11-04 DIAGNOSIS — G4733 Obstructive sleep apnea (adult) (pediatric): Secondary | ICD-10-CM

## 2024-11-22 ENCOUNTER — Telehealth: Payer: Self-pay | Admitting: Pulmonary Disease

## 2024-11-22 DIAGNOSIS — G4733 Obstructive sleep apnea (adult) (pediatric): Secondary | ICD-10-CM | POA: Diagnosis not present

## 2024-11-22 NOTE — Telephone Encounter (Signed)
 Call patient  Sleep study result  Date of study: 11/04/2024  Impression: Mild obstructive sleep apnea with AHI of 5.3 with O2 nadir of 85%. Saturations below 88% for 0.5 minutes  Similar to history notable for mild obstructive sleep apnea  Recommendation: Options for treating mild obstructive sleep apnea may include CPAP therapy if there are significant daytime symptoms or notable comorbidities. Auto CPAP 5-15 with heated humidification and the patient's preferred mask may be considered; other treatment options may include an oral device, watchful waiting with significant weight loss efforts.

## 2024-11-23 NOTE — Telephone Encounter (Signed)
 Patient would like sleep study scanned in,so she is able to view in mychart,patient already has a cpap machine would like report to interpret on her own,sending to [facility] for scanning in

## 2024-11-24 NOTE — Telephone Encounter (Signed)
 Sleep study has been scanned in for the patient

## 2024-11-24 NOTE — Telephone Encounter (Signed)
 Dr. Pawar, The patient's sleep study results have been scanned into the chart and she has been made aware.  Please advise regarding the letter that she is requesting.  Thank you.

## 2024-11-25 ENCOUNTER — Encounter: Payer: Self-pay | Admitting: *Deleted

## 2024-11-25 NOTE — Telephone Encounter (Signed)
 Dr. Pawar, I put a letter at your desk for you to review for this patient. Thank you.

## 2024-11-26 ENCOUNTER — Other Ambulatory Visit (HOSPITAL_COMMUNITY): Payer: Self-pay

## 2024-11-27 NOTE — Telephone Encounter (Signed)
 Sleep study has been scanned in by Erin on 11/27/23. nfn

## 2024-12-14 ENCOUNTER — Ambulatory Visit: Payer: Self-pay

## 2025-02-04 ENCOUNTER — Ambulatory Visit
